# Patient Record
Sex: Male | Born: 1965 | Race: White | Hispanic: No | Marital: Married | Smoking: Former smoker
Health system: Southern US, Community
[De-identification: ages and names within clinical notes are randomized; demographics above are authoritative.]

## PROBLEM LIST (undated history)

## (undated) DIAGNOSIS — K4020 Bilateral inguinal hernia, without obstruction or gangrene, not specified as recurrent: Secondary | ICD-10-CM

## (undated) DIAGNOSIS — M5020 Other cervical disc displacement, unspecified cervical region: Secondary | ICD-10-CM

## (undated) DIAGNOSIS — R51 Headache: Secondary | ICD-10-CM

## (undated) DIAGNOSIS — M1990 Unspecified osteoarthritis, unspecified site: Secondary | ICD-10-CM

## (undated) DIAGNOSIS — E7800 Pure hypercholesterolemia, unspecified: Secondary | ICD-10-CM

## (undated) DIAGNOSIS — F329 Major depressive disorder, single episode, unspecified: Secondary | ICD-10-CM

## (undated) DIAGNOSIS — F32A Depression, unspecified: Secondary | ICD-10-CM

## (undated) DIAGNOSIS — R519 Headache, unspecified: Secondary | ICD-10-CM

## (undated) DIAGNOSIS — K219 Gastro-esophageal reflux disease without esophagitis: Secondary | ICD-10-CM

## (undated) HISTORY — PX: COLONOSCOPY: SHX174

## (undated) HISTORY — PX: KNEE SURGERY: SHX244

## (undated) HISTORY — PX: OTHER SURGICAL HISTORY: SHX169

## (undated) HISTORY — PX: NECK SURGERY: SHX720

## (undated) HISTORY — DX: Other cervical disc displacement, unspecified cervical region: M50.20

## (undated) HISTORY — PX: ORTHOPEDIC SURGERY: SHX850

## (undated) HISTORY — PX: JOINT REPLACEMENT: SHX530

---

## 2004-04-13 ENCOUNTER — Inpatient Hospital Stay (HOSPITAL_COMMUNITY): Admission: RE | Admit: 2004-04-13 | Discharge: 2004-04-17 | Payer: Self-pay | Admitting: Specialist

## 2014-12-20 HISTORY — PX: OTHER SURGICAL HISTORY: SHX169

## 2015-02-05 ENCOUNTER — Encounter (HOSPITAL_COMMUNITY): Payer: Self-pay

## 2015-02-05 ENCOUNTER — Emergency Department (HOSPITAL_COMMUNITY)
Admission: EM | Admit: 2015-02-05 | Discharge: 2015-02-05 | Disposition: A | Discharge status: Home / Self Care | Payer: Medicare Other | Attending: Emergency Medicine | Admitting: Emergency Medicine

## 2015-02-05 ENCOUNTER — Emergency Department (HOSPITAL_COMMUNITY): Payer: Medicare Other

## 2015-02-05 DIAGNOSIS — R079 Chest pain, unspecified: Secondary | ICD-10-CM | POA: Diagnosis present

## 2015-02-05 DIAGNOSIS — M791 Myalgia: Secondary | ICD-10-CM | POA: Diagnosis not present

## 2015-02-05 DIAGNOSIS — Z8659 Personal history of other mental and behavioral disorders: Secondary | ICD-10-CM | POA: Insufficient documentation

## 2015-02-05 DIAGNOSIS — R0789 Other chest pain: Secondary | ICD-10-CM | POA: Diagnosis not present

## 2015-02-05 DIAGNOSIS — Z8719 Personal history of other diseases of the digestive system: Secondary | ICD-10-CM | POA: Diagnosis not present

## 2015-02-05 DIAGNOSIS — R011 Cardiac murmur, unspecified: Secondary | ICD-10-CM | POA: Diagnosis not present

## 2015-02-05 DIAGNOSIS — Z8639 Personal history of other endocrine, nutritional and metabolic disease: Secondary | ICD-10-CM | POA: Diagnosis not present

## 2015-02-05 HISTORY — DX: Depression, unspecified: F32.A

## 2015-02-05 HISTORY — DX: Major depressive disorder, single episode, unspecified: F32.9

## 2015-02-05 HISTORY — DX: Gastro-esophageal reflux disease without esophagitis: K21.9

## 2015-02-05 HISTORY — DX: Pure hypercholesterolemia, unspecified: E78.00

## 2015-02-05 LAB — CBC
HCT: 45.1 % (ref 39.0–52.0)
HEMOGLOBIN: 15.2 g/dL (ref 13.0–17.0)
MCH: 32.5 pg (ref 26.0–34.0)
MCHC: 33.7 g/dL (ref 30.0–36.0)
MCV: 96.4 fL (ref 78.0–100.0)
PLATELETS: 261 10*3/uL (ref 150–400)
RBC: 4.68 MIL/uL (ref 4.22–5.81)
RDW: 13.2 % (ref 11.5–15.5)
WBC: 7.4 10*3/uL (ref 4.0–10.5)

## 2015-02-05 LAB — BASIC METABOLIC PANEL
Anion gap: 8 (ref 5–15)
BUN: 13 mg/dL (ref 6–23)
CO2: 25 mmol/L (ref 19–32)
Calcium: 9.3 mg/dL (ref 8.4–10.5)
Chloride: 104 mmol/L (ref 96–112)
Creatinine, Ser: 0.84 mg/dL (ref 0.50–1.35)
GFR calc Af Amer: >90 mL/min (ref >90)
GLUCOSE: 109 mg/dL — AB (ref 70–99)
POTASSIUM: 4 mmol/L (ref 3.5–5.1)
Sodium: 137 mmol/L (ref 135–145)

## 2015-02-05 LAB — TROPONIN I
Troponin I: <0.03 ng/mL (ref <0.031)
Troponin I: <0.03 ng/mL (ref <0.031)

## 2015-02-05 LAB — HEPATIC FUNCTION PANEL
ALT: 43 U/L (ref 0–53)
AST: 33 U/L (ref 0–37)
Albumin: 4.2 g/dL (ref 3.5–5.2)
Alkaline Phosphatase: 78 U/L (ref 39–117)
Total Bilirubin: 0.6 mg/dL (ref 0.3–1.2)
Total Protein: 7.7 g/dL (ref 6.0–8.3)

## 2015-02-05 MED ORDER — MORPHINE SULFATE 4 MG/ML IJ SOLN
4.0000 mg | Freq: Once | INTRAMUSCULAR | Status: AC
  Administered 2015-02-05: 4 mg via INTRAVENOUS
  Filled 2015-02-05: qty 1

## 2015-02-05 MED ORDER — MORPHINE SULFATE 4 MG/ML IJ SOLN
INTRAMUSCULAR | Status: AC
  Filled 2015-02-05: qty 1

## 2015-02-05 MED ORDER — MORPHINE SULFATE 4 MG/ML IJ SOLN
4.0000 mg | Freq: Once | INTRAMUSCULAR | Status: AC
  Administered 2015-02-05: 4 mg via INTRAVENOUS

## 2015-02-05 NOTE — ED Provider Notes (Signed)
CSN: [id number]     Arrival date & time 02/05/15  1025 History  This chart was scribed for Kevin Diego, MD by Chester Holstein, ED Scribe. This patient was seen in room APA02/APA02 and the patient's care was started at 11:52 AM.    Chief Complaint  Patient presents with  . Chest Pain      Patient is a 49 y.o. male presenting with chest pain. The history is provided by the patient. No language interpreter was used.  Chest Pain Associated symptoms: no abdominal pain, no back pain, no cough, no diaphoresis, no fatigue, no headache and no shortness of breath    HPI Comments: Kevin Hill is a 49 y.o. male with PMHx of HLD, depression, and anxiety who presents to the Emergency Department complaining of intermittent achy left sided chest pain with onset 2 days ago. Pt notes pain radiates to left arm. Pt denies any aggravating or alleviating factors. Pt has h/o bulging cervical dics, causing recurrent pain in right arm at baseline. Pt was seen by orthopedist this morning and advised to come to ED. Pt denies SOB and diaphoresis. Pt is a smoker.  Pt has been seen by a cardiologist in past for h/o heart murmur. Pt's PCP is Dr. Ahmed Prima.   Past Medical History  Diagnosis Date  . High cholesterol   . Depression   . Acid reflux    Past Surgical History  Procedure Laterality Date  . Orthopedic surgery     No family history on file. History  Substance Use Topics  . Smoking status: Not on file  . Smokeless tobacco: Not on file  . Alcohol Use: No    Review of Systems  Constitutional: Negative for diaphoresis, appetite change and fatigue.  HENT: Negative for congestion, ear discharge and sinus pressure.   Eyes: Negative for discharge.  Respiratory: Negative for cough and shortness of breath.   Cardiovascular: Positive for chest pain.  Gastrointestinal: Negative for abdominal pain and diarrhea.  Genitourinary: Negative for frequency and hematuria.  Musculoskeletal: Positive for myalgias.  Negative for back pain.  Skin: Negative for rash.  Neurological: Negative for seizures and headaches.  Psychiatric/Behavioral: Negative for hallucinations.      Allergies  Review of patient's allergies indicates no known allergies.  Home Medications   Prior to Admission medications   Not on File   BP 138/92 mmHg  Pulse 72  Temp(Src) 98.1 F (36.7 C) (Oral)  Resp 16  Ht 5\' 9"  (1.753 m)  Wt 184 lb (83.462 kg)  BMI 27.16 kg/m2  SpO2 97% Physical Exam  Constitutional: He is oriented to person, place, and time. He appears well-developed and well-nourished.  HENT:  Head: Normocephalic.  Eyes: Conjunctivae and EOM are normal. No scleral icterus.  Neck: Neck supple. No thyromegaly present.  Cardiovascular: Normal rate and regular rhythm.  Exam reveals no gallop and no friction rub.   No murmur heard. Pulmonary/Chest: Effort normal and breath sounds normal. No stridor. He has no wheezes. He has no rales. He exhibits no tenderness.  Abdominal: He exhibits no distension. There is no tenderness. There is no rebound.  Musculoskeletal: Normal range of motion. He exhibits no edema.  Lymphadenopathy:    He has no cervical adenopathy.  Neurological: He is alert and oriented to person, place, and time. He exhibits normal muscle tone. Coordination normal.  Skin: No rash noted. No erythema.  Psychiatric: He has a normal mood and affect. His behavior is normal.  Nursing note and vitals reviewed.  ED Course  Procedures (including critical care time) DIAGNOSTIC STUDIES: Oxygen Saturation is 97% on room air, normal by my interpretation.    COORDINATION OF CARE: 11:54 AM Discussed treatment plan with patient at beside, the patient agrees with the plan and has no further questions at this time.    Labs Review Labs Reviewed  CBC  BASIC METABOLIC PANEL  TROPONIN I    Imaging Review No results found.   EKG Interpretation   Date/Time:  Wednesday February 05 2015 10:37:16  EST Ventricular Rate:  76 PR Interval:  166 QRS Duration: 95 QT Interval:  380 QTC Calculation: 427 R Axis:   62 Text Interpretation:  Sinus rhythm Probable left atrial enlargement  Confirmed by Damarian Priola  MD, Teiana Hajduk [phone]) on 02/05/2015 3:06:20 PM      MDM   Final diagnoses:  None   Chest pain with 2 negative troponins,   Will have pt follow up with card    The chart was scribed for me under my direct supervision.  I personally performed the history, physical, and medical decision making and all procedures in the evaluation of this patient.Kevin Diego, MD 02/05/15 [phone]

## 2015-02-05 NOTE — ED Notes (Signed)
Pt states he lives in [place] but he didn't want to go to their ER. States he looked up his symptoms on the Internet and it scared him so he came here

## 2015-02-05 NOTE — ED Notes (Signed)
Pt complain of chest pain that started last night. Pt states he has bulging disk in his neck

## 2015-02-05 NOTE — Discharge Instructions (Signed)
Take one aspirin a day and follow up with [facility] in [place] in a week.

## 2015-06-09 ENCOUNTER — Telehealth: Payer: Self-pay

## 2015-06-09 NOTE — Telephone Encounter (Signed)
Pt was referred by [facility] for screening colonoscopy. He had left VM he is ready to schedule and I returned call and LMOM to call. ( Please see med list, looks like he will need OV).

## 2015-06-10 NOTE — Telephone Encounter (Signed)
I called pt and he is scheduled an OV on 06/18/2015 at 9:00 AM with Walden Field, NP due to his med list.

## 2015-06-18 ENCOUNTER — Encounter: Payer: Self-pay | Admitting: Nurse Practitioner

## 2015-06-18 ENCOUNTER — Ambulatory Visit (INDEPENDENT_AMBULATORY_CARE_PROVIDER_SITE_OTHER): Payer: Medicare Other | Admitting: Nurse Practitioner

## 2015-06-18 ENCOUNTER — Other Ambulatory Visit: Payer: Self-pay

## 2015-06-18 VITALS — BP 139/80 | HR 91 | Temp 97.6°F | Ht 69.0 in | Wt 190.0 lb

## 2015-06-18 DIAGNOSIS — Z1211 Encounter for screening for malignant neoplasm of colon: Secondary | ICD-10-CM | POA: Insufficient documentation

## 2015-06-18 MED ORDER — PEG 3350-KCL-NA BICARB-NACL 420 G PO SOLR: 4000.0000 mL | Freq: Once | ORAL | Status: DC

## 2015-06-18 NOTE — Progress Notes (Signed)
cc'd to pcp 

## 2015-06-18 NOTE — Assessment & Plan Note (Signed)
49 year old male presents for screening colonoscopy. Has had a previous colonoscopy over 10 years ago but cannot remember what for this was completed in [place]. Was referred by PCP. Is currently asymptomatic from a GI standpoint. No red flag/warning signs or symptoms. We will proceed with colonoscopy.  Proceed with TCS in the OR with propofol with Dr. Gala Romney in near future: the risks, benefits, and alternatives have been discussed with the patient in detail. The patient states understanding and desires to proceed.  The patient is not on any anticoagulants other than 81 mg daily aspirin. He is on Wellbutrin 150 mg twice a day, Zoloft 100 mg daily, Ambien at night, and Xanax when necessary. He also consumes about a 12 pack of alcohol a week. Due to polypharmacy and alcohol consumption we'll plan for procedure and the OR. He states he has never had a problem with anesthesia is by multiple surgeries.

## 2015-06-18 NOTE — Progress Notes (Signed)
Primary Care Physician:  Renee Rival, NP Primary Gastroenterologist:  Dr. Gala Romney  Chief Complaint  Patient presents with  . Colonoscopy    HPI:   49 year old male presents on referral from PCP for initial screening colonoscopy. PCP note reviewed. Was recently in the ER for chest pain and workup was normal and felt to be due to stress/anxiety. Patient was not able to be phone triage and referred for office visit due to medications. No GI procedures found in the HR system.   Today he states he had a colonoscopy over 10 years ago in [place] for reasons he cannot remember. Denies abdominal pain, N/V, hematochezia, melena. Denies chest pain, dyspnea, dizziness, lightheadedness, syncope, near syncope. Denies any other upper or lower GI symptoms.   Past Medical History  Diagnosis Date  . High cholesterol   . Depression   . Acid reflux     Past Surgical History  Procedure Laterality Date  . Orthopedic surgery    . Colonoscopy  about 10 years ago    in [place]  . Knee surgery      right  . Neck surgery      Current Outpatient Prescriptions  Medication Sig Dispense Refill  . ALPRAZolam (XANAX) 0.5 MG tablet Take 0.5 mg by mouth daily as needed for anxiety.    Marland Kitchen buPROPion (WELLBUTRIN SR) 150 MG 12 hr tablet Take 150 mg by mouth 2 (two) times daily.    Marland Kitchen omeprazole (PRILOSEC) 20 MG capsule Take 20 mg by mouth daily.    . pravastatin (PRAVACHOL) 40 MG tablet Take 40 mg by mouth daily.    . sertraline (ZOLOFT) 100 MG tablet Take 200 mg by mouth daily.    Marland Kitchen zolpidem (AMBIEN) 5 MG tablet Take 5 mg by mouth at bedtime as needed for sleep.    Marland Kitchen HYDROcodone-acetaminophen (NORCO/VICODIN) 5-325 MG per tablet Take 1 tablet by mouth every 6 (six) hours as needed (pain).     No current facility-administered medications for this visit.    Allergies as of 06/18/2015  . (No Known Allergies)    Family History  Problem Relation Age of Onset  . Colon cancer Neg Hx     History    Social History  . Marital Status: Divorced    Spouse Name: N/A  . Number of Children: N/A  . Years of Education: N/A   Occupational History  . Not on file.   Social History Main Topics  . Smoking status: Current Every Day Smoker -- 0.50 packs/day    Types: Cigarettes  . Smokeless tobacco: Not on file  . Alcohol Use: 0.0 oz/week    0 Standard drinks or equivalent per week     Comment: 12 pack in a week  . Drug Use: No  . Sexual Activity: Not on file   Other Topics Concern  . Not on file   Social History Narrative    Review of Systems: General: Negative for anorexia, weight loss, fever, chills, fatigue, weakness. Eyes: Negative for vision changes.  ENT: Negative for hoarseness, difficulty swallowing. CV: Negative for chest pain, angina, palpitations, dyspnea on exertion, peripheral edema.  Respiratory: Negative for dyspnea at rest, dyspnea on exertion, cough, sputum, wheezing.  GI: See history of present illness. MS: Admits chronic neck pain.  Derm: Negative for rash or itching.  Endo: Negative for unusual weight change.  Heme: Negative for bruising or bleeding.    Physical Exam: BP 139/80 mmHg  Pulse 91  Temp(Src) 97.6 F (36.4  C) (Oral)  Ht 5\' 9"  (1.753 m)  Wt 190 lb (86.183 kg)  BMI 28.05 kg/m2 General:   Alert and oriented. Pleasant and cooperative. Well-nourished and well-developed.  Head:  Normocephalic and atraumatic. Eyes:  Without icterus, sclera clear and conjunctiva pink.  Ears:  Normal auditory acuity. Cardiovascular:  S1, S2 present without murmurs appreciated. Normal pulses noted. Extremities without clubbing or edema. Respiratory:  Clear to auscultation bilaterally. No wheezes, rales, or rhonchi. No distress.  Gastrointestinal:  +BS, soft, non-tender and non-distended. No HSM noted. No guarding or rebound. No masses appreciated.  Rectal:  Deferred  Skin:  Intact without significant lesions or rashes. Neurologic:  Alert and oriented x4;  grossly  normal neurologically. Psych:  Alert and cooperative. Normal mood and affect. Heme/Lymph/Immune: No excessive bruising noted.    06/18/2015 9:27 AM

## 2015-06-18 NOTE — Patient Instructions (Signed)
1. We will schedule your procedure for you. 2. Further recommendations to be based on results of your procedure. 

## 2015-07-07 NOTE — Patient Instructions (Signed)
Kevin Hill  07/07/2015     @PREFPERIOPPHARMACY @   Your procedure is scheduled on  07/10/2015   Report to [facility] at  730  A.M.  Call this number if you have problems the morning of surgery:  [phone]   Remember:  Do not eat food or drink liquids after midnight.  Take these medicines the morning of surgery with A SIP OF WATER xanax, wellbutrin, prilosec, zoloft   Do not wear jewelry, make-up or nail polish.  Do not wear lotions, powders, or perfumes.   Do not shave 48 hours prior to surgery.  Men may shave face and neck.  Do not bring valuables to the hospital.  [facility] is not responsible for any belongings or valuables.  Contacts, dentures or bridgework may not be worn into surgery.  Leave your suitcase in the car.  After surgery it may be brought to your room.  For patients admitted to the hospital, discharge time will be determined by your treatment team.  Patients discharged the day of surgery will not be allowed to drive home.   Name and phone number of your driver:   family Special instructions:  Follow the diet and prep instructions given to you by Dr Roseanne Kaufman office.  Please read over the following fact sheets that you were given. Pain Booklet, Coughing and Deep Breathing, Surgical Site Infection Prevention, Anesthesia Post-op Instructions and Care and Recovery After Surgery      Colonoscopy A colonoscopy is an exam to look at the entire large intestine (colon). This exam can help find problems such as tumors, polyps, inflammation, and areas of bleeding. The exam takes about 1 hour.  LET [facility] CARE PROVIDER KNOW ABOUT:   Any allergies you have.  All medicines you are taking, including vitamins, herbs, eye drops, creams, and over-the-counter medicines.  Previous problems you or members of your family have had with the use of anesthetics.  Any blood disorders you have.  Previous surgeries you have had.  Medical conditions you  have. RISKS AND COMPLICATIONS  Generally, this is a safe procedure. However, as with any procedure, complications can occur. Possible complications include:  Bleeding.  Tearing or rupture of the colon wall.  Reaction to medicines given during the exam.  Infection (rare). BEFORE THE PROCEDURE   Ask your health care provider about changing or stopping your regular medicines.  You may be prescribed an oral bowel prep. This involves drinking a large amount of medicated liquid, starting the day before your procedure. The liquid will cause you to have multiple loose stools until your stool is almost clear or light green. This cleans out your colon in preparation for the procedure.  Do not eat or drink anything else once you have started the bowel prep, unless your health care provider tells you it is safe to do so.  Arrange for someone to drive you home after the procedure. PROCEDURE   You will be given medicine to help you relax (sedative).  You will lie on your side with your knees bent.  A long, flexible tube with a light and camera on the end (colonoscope) will be inserted through the rectum and into the colon. The camera sends video back to a computer screen as it moves through the colon. The colonoscope also releases carbon dioxide gas to inflate the colon. This helps your health care provider see the area better.  During the exam, your health care provider may take a  small tissue sample (biopsy) to be examined under a microscope if any abnormalities are found.  The exam is finished when the entire colon has been viewed. AFTER THE PROCEDURE   Do not drive for 24 hours after the exam.  You may have a small amount of blood in your stool.  You may pass moderate amounts of gas and have mild abdominal cramping or bloating. This is caused by the gas used to inflate your colon during the exam.  Ask when your test results will be ready and how you will get your results. Make sure you  get your test results. Document Released: 12/03/2000 Document Revised: 09/26/2013 Document Reviewed: 08/13/2013 [facility] Patient Information 2015 [place], [place]. This information is not intended to replace advice given to you by your health care provider. Make sure you discuss any questions you have with your health care provider. PATIENT INSTRUCTIONS POST-ANESTHESIA  IMMEDIATELY FOLLOWING SURGERY:  Do not drive or operate machinery for the first twenty four hours after surgery.  Do not make any important decisions for twenty four hours after surgery or while taking narcotic pain medications or sedatives.  If you develop intractable nausea and vomiting or a severe headache please notify your doctor immediately.  FOLLOW-UP:  Please make an appointment with your surgeon as instructed. You do not need to follow up with anesthesia unless specifically instructed to do so.  WOUND CARE INSTRUCTIONS (if applicable):  Keep a dry clean dressing on the anesthesia/puncture wound site if there is drainage.  Once the wound has quit draining you may leave it open to air.  Generally you should leave the bandage intact for twenty four hours unless there is drainage.  If the epidural site drains for more than 36-48 hours please call the anesthesia department.  QUESTIONS?:  Please feel free to call your physician or the hospital operator if you have any questions, and they will be happy to assist you.

## 2015-07-08 ENCOUNTER — Encounter (HOSPITAL_COMMUNITY)
Admission: RE | Admit: 2015-07-08 | Discharge: 2015-07-08 | Disposition: A | Discharge status: Home / Self Care | Payer: Medicare Other | Source: Ambulatory Visit | Attending: Internal Medicine | Admitting: Internal Medicine

## 2015-07-08 ENCOUNTER — Encounter (HOSPITAL_COMMUNITY): Payer: Self-pay

## 2015-07-08 DIAGNOSIS — E780 Pure hypercholesterolemia: Secondary | ICD-10-CM | POA: Diagnosis not present

## 2015-07-08 DIAGNOSIS — M1990 Unspecified osteoarthritis, unspecified site: Secondary | ICD-10-CM | POA: Diagnosis not present

## 2015-07-08 DIAGNOSIS — F17210 Nicotine dependence, cigarettes, uncomplicated: Secondary | ICD-10-CM | POA: Diagnosis not present

## 2015-07-08 DIAGNOSIS — F329 Major depressive disorder, single episode, unspecified: Secondary | ICD-10-CM | POA: Diagnosis not present

## 2015-07-08 DIAGNOSIS — D123 Benign neoplasm of transverse colon: Secondary | ICD-10-CM | POA: Diagnosis not present

## 2015-07-08 DIAGNOSIS — Z79899 Other long term (current) drug therapy: Secondary | ICD-10-CM | POA: Diagnosis not present

## 2015-07-08 DIAGNOSIS — Z1211 Encounter for screening for malignant neoplasm of colon: Secondary | ICD-10-CM | POA: Diagnosis not present

## 2015-07-08 DIAGNOSIS — K5730 Diverticulosis of large intestine without perforation or abscess without bleeding: Secondary | ICD-10-CM | POA: Diagnosis not present

## 2015-07-08 DIAGNOSIS — D120 Benign neoplasm of cecum: Secondary | ICD-10-CM | POA: Diagnosis not present

## 2015-07-08 DIAGNOSIS — K219 Gastro-esophageal reflux disease without esophagitis: Secondary | ICD-10-CM | POA: Diagnosis not present

## 2015-07-08 DIAGNOSIS — D122 Benign neoplasm of ascending colon: Secondary | ICD-10-CM | POA: Diagnosis not present

## 2015-07-08 HISTORY — DX: Unspecified osteoarthritis, unspecified site: M19.90

## 2015-07-08 HISTORY — DX: Headache, unspecified: R51.9

## 2015-07-08 HISTORY — DX: Headache: R51

## 2015-07-08 LAB — CBC WITH DIFFERENTIAL/PLATELET
BASOS ABS: 0.1 10*3/uL (ref 0.0–0.1)
BASOS PCT: 1 % (ref 0–1)
EOS ABS: 0.6 10*3/uL (ref 0.0–0.7)
EOS PCT: 9 % — AB (ref 0–5)
HCT: 43.5 % (ref 39.0–52.0)
Hemoglobin: 14.5 g/dL (ref 13.0–17.0)
LYMPHS PCT: 36 % (ref 12–46)
Lymphs Abs: 2.4 10*3/uL (ref 0.7–4.0)
MCH: 30.9 pg (ref 26.0–34.0)
MCHC: 33.3 g/dL (ref 30.0–36.0)
MCV: 92.8 fL (ref 78.0–100.0)
MONO ABS: 0.7 10*3/uL (ref 0.1–1.0)
Monocytes Relative: 10 % (ref 3–12)
NEUTROS ABS: 3 10*3/uL (ref 1.7–7.7)
NEUTROS PCT: 44 % (ref 43–77)
Platelets: 278 10*3/uL (ref 150–400)
RBC: 4.69 MIL/uL (ref 4.22–5.81)
RDW: 13.1 % (ref 11.5–15.5)
WBC: 6.7 10*3/uL (ref 4.0–10.5)

## 2015-07-08 LAB — BASIC METABOLIC PANEL
ANION GAP: 7 (ref 5–15)
BUN: 12 mg/dL (ref 6–20)
CALCIUM: 9.2 mg/dL (ref 8.9–10.3)
CHLORIDE: 105 mmol/L (ref 101–111)
CO2: 26 mmol/L (ref 22–32)
Creatinine, Ser: 0.81 mg/dL (ref 0.61–1.24)
GLUCOSE: 78 mg/dL (ref 65–99)
Potassium: 4.5 mmol/L (ref 3.5–5.1)
SODIUM: 138 mmol/L (ref 135–145)

## 2015-07-10 ENCOUNTER — Encounter (HOSPITAL_COMMUNITY): Payer: Self-pay

## 2015-07-10 ENCOUNTER — Encounter (HOSPITAL_COMMUNITY)
Admission: RE | Disposition: A | Discharge status: Home / Self Care | Payer: Self-pay | Source: Ambulatory Visit | Attending: Internal Medicine

## 2015-07-10 ENCOUNTER — Ambulatory Visit (HOSPITAL_COMMUNITY): Payer: Medicare Other | Admitting: Anesthesiology

## 2015-07-10 ENCOUNTER — Ambulatory Visit (HOSPITAL_COMMUNITY)
Admission: RE | Admit: 2015-07-10 | Discharge: 2015-07-10 | Disposition: A | Discharge status: Home / Self Care | Payer: Medicare Other | Source: Ambulatory Visit | Attending: Internal Medicine | Admitting: Internal Medicine

## 2015-07-10 DIAGNOSIS — Z1211 Encounter for screening for malignant neoplasm of colon: Secondary | ICD-10-CM | POA: Diagnosis not present

## 2015-07-10 DIAGNOSIS — D120 Benign neoplasm of cecum: Secondary | ICD-10-CM | POA: Diagnosis not present

## 2015-07-10 DIAGNOSIS — K5730 Diverticulosis of large intestine without perforation or abscess without bleeding: Secondary | ICD-10-CM | POA: Diagnosis not present

## 2015-07-10 DIAGNOSIS — F17210 Nicotine dependence, cigarettes, uncomplicated: Secondary | ICD-10-CM | POA: Insufficient documentation

## 2015-07-10 DIAGNOSIS — D122 Benign neoplasm of ascending colon: Secondary | ICD-10-CM | POA: Diagnosis not present

## 2015-07-10 DIAGNOSIS — M1990 Unspecified osteoarthritis, unspecified site: Secondary | ICD-10-CM | POA: Insufficient documentation

## 2015-07-10 DIAGNOSIS — D123 Benign neoplasm of transverse colon: Secondary | ICD-10-CM | POA: Insufficient documentation

## 2015-07-10 DIAGNOSIS — K219 Gastro-esophageal reflux disease without esophagitis: Secondary | ICD-10-CM | POA: Insufficient documentation

## 2015-07-10 DIAGNOSIS — F329 Major depressive disorder, single episode, unspecified: Secondary | ICD-10-CM | POA: Insufficient documentation

## 2015-07-10 DIAGNOSIS — Z86010 Personal history of colonic polyps: Secondary | ICD-10-CM | POA: Insufficient documentation

## 2015-07-10 DIAGNOSIS — Z79899 Other long term (current) drug therapy: Secondary | ICD-10-CM | POA: Insufficient documentation

## 2015-07-10 DIAGNOSIS — E780 Pure hypercholesterolemia: Secondary | ICD-10-CM | POA: Insufficient documentation

## 2015-07-10 HISTORY — PX: COLONOSCOPY WITH PROPOFOL: SHX5780

## 2015-07-10 HISTORY — PX: POLYPECTOMY: SHX5525

## 2015-07-10 SURGERY — COLONOSCOPY WITH PROPOFOL
Anesthesia: Monitor Anesthesia Care

## 2015-07-10 MED ORDER — PROPOFOL INFUSION 10 MG/ML OPTIME
INTRAVENOUS | Status: DC | PRN
  Administered 2015-07-10: 09:00:00 via INTRAVENOUS
  Administered 2015-07-10: 150 ug/kg/min via INTRAVENOUS

## 2015-07-10 MED ORDER — FENTANYL CITRATE (PF) 100 MCG/2ML IJ SOLN: 25.0000 ug | INTRAMUSCULAR | Status: DC | PRN

## 2015-07-10 MED ORDER — PROPOFOL 10 MG/ML IV BOLUS
INTRAVENOUS | Status: DC | PRN
  Administered 2015-07-10: 20 mg via INTRAVENOUS

## 2015-07-10 MED ORDER — MIDAZOLAM HCL 5 MG/5ML IJ SOLN
INTRAMUSCULAR | Status: DC | PRN
  Administered 2015-07-10: 2 mg via INTRAVENOUS

## 2015-07-10 MED ORDER — WATER FOR IRRIGATION, STERILE IR SOLN
Status: DC | PRN
  Administered 2015-07-10: 1000 mL

## 2015-07-10 MED ORDER — GLYCOPYRROLATE 0.2 MG/ML IJ SOLN
INTRAMUSCULAR | Status: AC
  Filled 2015-07-10: qty 1

## 2015-07-10 MED ORDER — STERILE WATER FOR IRRIGATION IR SOLN
Status: DC | PRN
  Administered 2015-07-10: 1000 mL

## 2015-07-10 MED ORDER — FENTANYL CITRATE (PF) 100 MCG/2ML IJ SOLN
25.0000 ug | INTRAMUSCULAR | Status: AC
  Administered 2015-07-10 (×2): 25 ug via INTRAVENOUS

## 2015-07-10 MED ORDER — PROPOFOL 10 MG/ML IV BOLUS
INTRAVENOUS | Status: AC
  Filled 2015-07-10: qty 20

## 2015-07-10 MED ORDER — MIDAZOLAM HCL 2 MG/2ML IJ SOLN
1.0000 mg | INTRAMUSCULAR | Status: DC | PRN
Start: 2015-07-10 — End: 2015-07-10
  Administered 2015-07-10: 2 mg via INTRAVENOUS
  Filled 2015-07-10: qty 2

## 2015-07-10 MED ORDER — ONDANSETRON HCL 4 MG/2ML IJ SOLN
INTRAMUSCULAR | Status: AC
  Filled 2015-07-10: qty 2

## 2015-07-10 MED ORDER — ONDANSETRON HCL 4 MG/2ML IJ SOLN: 4.0000 mg | Freq: Once | INTRAMUSCULAR | Status: DC | PRN

## 2015-07-10 MED ORDER — MIDAZOLAM HCL 2 MG/2ML IJ SOLN
INTRAMUSCULAR | Status: AC
  Filled 2015-07-10: qty 2

## 2015-07-10 MED ORDER — LACTATED RINGERS IV SOLN
INTRAVENOUS | Status: DC
  Administered 2015-07-10: 08:00:00 via INTRAVENOUS

## 2015-07-10 MED ORDER — ONDANSETRON HCL 4 MG/2ML IJ SOLN
4.0000 mg | Freq: Once | INTRAMUSCULAR | Status: AC
Start: 2015-07-10 — End: 2015-07-10
  Administered 2015-07-10: 4 mg via INTRAVENOUS

## 2015-07-10 MED ORDER — GLYCOPYRROLATE 0.2 MG/ML IJ SOLN
0.2000 mg | Freq: Once | INTRAMUSCULAR | Status: AC
  Administered 2015-07-10: 0.2 mg via INTRAVENOUS

## 2015-07-10 MED ORDER — FENTANYL CITRATE (PF) 100 MCG/2ML IJ SOLN
INTRAMUSCULAR | Status: AC
  Filled 2015-07-10: qty 2

## 2015-07-10 SURGICAL SUPPLY — 21 items
ELECT REM PT RETURN 9FT ADLT (ELECTROSURGICAL) ×3
ELECTRODE REM PT RTRN 9FT ADLT (ELECTROSURGICAL) ×1 IMPLANT
FCP BXJMBJMB 240X2.8X (CUTTING FORCEPS)
FLOOR PAD 36X40 (MISCELLANEOUS) ×3
FORCEPS BIOP RAD 4 LRG CAP 4 (CUTTING FORCEPS) ×3 IMPLANT
FORCEPS BIOP RJ4 240 W/NDL (CUTTING FORCEPS)
FORCEPS BXJMBJMB 240X2.8X (CUTTING FORCEPS) IMPLANT
FORMALIN 10 PREFIL 20ML (MISCELLANEOUS) ×9 IMPLANT
INJECTOR/SNARE I SNARE (MISCELLANEOUS) IMPLANT
KIT ENDO PROCEDURE PEN (KITS) ×3 IMPLANT
MANIFOLD NEPTUNE II (INSTRUMENTS) ×3 IMPLANT
NEEDLE SCLEROTHERAPY 25GX240 (NEEDLE) IMPLANT
PAD FLOOR 36X40 (MISCELLANEOUS) ×1 IMPLANT
PROBE APC STR FIRE (PROBE) IMPLANT
PROBE INJECTION GOLD (MISCELLANEOUS)
PROBE INJECTION GOLD 7FR (MISCELLANEOUS) IMPLANT
SNARE ROTATE MED OVAL 20MM (MISCELLANEOUS) ×3 IMPLANT
SNARE SHORT THROW 13M SML OVAL (MISCELLANEOUS) IMPLANT
TRAP SPECIMEN MUCOUS 40CC (MISCELLANEOUS) ×3 IMPLANT
TUBING IRRIGATION ENDOGATOR (MISCELLANEOUS) ×3 IMPLANT
WATER STERILE IRR 1000ML POUR (IV SOLUTION) ×3 IMPLANT

## 2015-07-10 NOTE — Interval H&P Note (Signed)
History and Physical Interval Note:  07/10/2015 8:50 AM  Kevin Hill  has presented today for surgery, with the diagnosis of screening  The various methods of treatment have been discussed with the patient and family. After consideration of risks, benefits and other options for treatment, the patient has consented to  Procedure(s) with comments: COLONOSCOPY WITH PROPOFOL (N/A) - 0900 as a surgical intervention .  The patient's history has been reviewed, patient examined, no change in status, stable for surgery.  I have reviewed the patient's chart and labs.  Questions were answered to the patient's satisfaction.     Kevin Hill  No change; screening TCS per plan.  The risks, benefits, limitations, alternatives and imponderables have been reviewed with the patient. Questions have been answered. All parties are agreeable.

## 2015-07-10 NOTE — H&P (View-Only) (Signed)
Primary Care Physician:  Renee Rival, NP Primary Gastroenterologist:  Dr. Gala Romney  Chief Complaint  Patient presents with  . Colonoscopy    HPI:   49 year old male presents on referral from PCP for initial screening colonoscopy. PCP note reviewed. Was recently in the ER for chest pain and workup was normal and felt to be due to stress/anxiety. Patient was not able to be phone triage and referred for office visit due to medications. No GI procedures found in the HR system.   Today he states he had a colonoscopy over 10 years ago in [place] for reasons he cannot remember. Denies abdominal pain, N/V, hematochezia, melena. Denies chest pain, dyspnea, dizziness, lightheadedness, syncope, near syncope. Denies any other upper or lower GI symptoms.   Past Medical History  Diagnosis Date  . High cholesterol   . Depression   . Acid reflux     Past Surgical History  Procedure Laterality Date  . Orthopedic surgery    . Colonoscopy  about 10 years ago    in [place]  . Knee surgery      right  . Neck surgery      Current Outpatient Prescriptions  Medication Sig Dispense Refill  . ALPRAZolam (XANAX) 0.5 MG tablet Take 0.5 mg by mouth daily as needed for anxiety.    Marland Kitchen buPROPion (WELLBUTRIN SR) 150 MG 12 hr tablet Take 150 mg by mouth 2 (two) times daily.    Marland Kitchen omeprazole (PRILOSEC) 20 MG capsule Take 20 mg by mouth daily.    . pravastatin (PRAVACHOL) 40 MG tablet Take 40 mg by mouth daily.    . sertraline (ZOLOFT) 100 MG tablet Take 200 mg by mouth daily.    Marland Kitchen zolpidem (AMBIEN) 5 MG tablet Take 5 mg by mouth at bedtime as needed for sleep.    Marland Kitchen HYDROcodone-acetaminophen (NORCO/VICODIN) 5-325 MG per tablet Take 1 tablet by mouth every 6 (six) hours as needed (pain).     No current facility-administered medications for this visit.    Allergies as of 06/18/2015  . (No Known Allergies)    Family History  Problem Relation Age of Onset  . Colon cancer Neg Hx     History    Social History  . Marital Status: Divorced    Spouse Name: N/A  . Number of Children: N/A  . Years of Education: N/A   Occupational History  . Not on file.   Social History Main Topics  . Smoking status: Current Every Day Smoker -- 0.50 packs/day    Types: Cigarettes  . Smokeless tobacco: Not on file  . Alcohol Use: 0.0 oz/week    0 Standard drinks or equivalent per week     Comment: 12 pack in a week  . Drug Use: No  . Sexual Activity: Not on file   Other Topics Concern  . Not on file   Social History Narrative    Review of Systems: General: Negative for anorexia, weight loss, fever, chills, fatigue, weakness. Eyes: Negative for vision changes.  ENT: Negative for hoarseness, difficulty swallowing. CV: Negative for chest pain, angina, palpitations, dyspnea on exertion, peripheral edema.  Respiratory: Negative for dyspnea at rest, dyspnea on exertion, cough, sputum, wheezing.  GI: See history of present illness. MS: Admits chronic neck pain.  Derm: Negative for rash or itching.  Endo: Negative for unusual weight change.  Heme: Negative for bruising or bleeding.    Physical Exam: BP 139/80 mmHg  Pulse 91  Temp(Src) 97.6 F (36.4  C) (Oral)  Ht 5\' 9"  (1.753 m)  Wt 190 lb (86.183 kg)  BMI 28.05 kg/m2 General:   Alert and oriented. Pleasant and cooperative. Well-nourished and well-developed.  Head:  Normocephalic and atraumatic. Eyes:  Without icterus, sclera clear and conjunctiva pink.  Ears:  Normal auditory acuity. Cardiovascular:  S1, S2 present without murmurs appreciated. Normal pulses noted. Extremities without clubbing or edema. Respiratory:  Clear to auscultation bilaterally. No wheezes, rales, or rhonchi. No distress.  Gastrointestinal:  +BS, soft, non-tender and non-distended. No HSM noted. No guarding or rebound. No masses appreciated.  Rectal:  Deferred  Skin:  Intact without significant lesions or rashes. Neurologic:  Alert and oriented x4;  grossly  normal neurologically. Psych:  Alert and cooperative. Normal mood and affect. Heme/Lymph/Immune: No excessive bruising noted.    06/18/2015 9:27 AM

## 2015-07-10 NOTE — Op Note (Deleted)
[facility] [address] [place], [address]   COLONOSCOPY PROCEDURE REPORT  PATIENT: Ashon, Rosenberg  MR#: [id number] BIRTHDATE: 1966-07-07 , 49  yrs. old GENDER: male ENDOSCOPIST: Dr. Cristopher Estimable. Rourk REFERRED OE:[id number] Strader, NP PROCEDURE DATE:  Jul 12, 2015 PROCEDURE:   Colonoscopy with snare polypectomy INDICATIONS:Average risk colorectal cancer screening examination. MEDICATIONS: Deep sedation per Dr.  [facility] ASA CLASS:       Class II  CONSENT: The risks, benefits, alternatives and imponderables including but not limited to bleeding, perforation as well as the possibility of a missed lesion have been reviewed.  The potential for biopsy, lesion removal, etc. have also been discussed. Questions have been answered.  All parties agreeable.  Please see the history and physical in the medical record for more information.  DESCRIPTION OF PROCEDURE:   After the risks benefits and alternatives of the procedure were thoroughly explained, informed consent was obtained.  The digital rectal exam revealed no rectal mass.   The     endoscope was introduced through the anus and advanced to the cecum, which was identified by both the appendix and ileocecal valve. No adverse events experienced.   The quality of the prep was adequate  The instrument was then slowly withdrawn as the colon was fully examined. Estimated blood loss is zero unless otherwise noted in this procedure report.      COLON FINDINGS: Normal-appearing rectal mucosa.  Scattered left-sided diverticula; (4) splenic flexure polyps?"the largest being approximate 7 mm in dimensions.  There were (4) ascending colon polyps?"the largest approximately 6 mm in dimensions.  There was (1) 3 mm polyp in the cecum; the remainder of the colonic mucosa appeared normal.  All polyps removed with cold snare technique except for the largest splenic flexure polyp which was removed with hot snare  technique. Retroflexion was performed. .  Withdrawal time=10 minutes 0 seconds.  The scope was withdrawn and the procedure completed. COMPLICATIONS:  ENDOSCOPIC IMPRESSION: Multiple colonic polyps?"removed as described above. Colonic diverticulosis.  RECOMMENDATIONS: Follow-up on pathology.  eSigned:  [place] Jul 12, 2015 3:47 PM Revised: 2015-07-12 3:47 PM  cc:  CPT CODES: ICD CODES:  The ICD and CPT codes recommended by this software are interpretations from the data that the clinical staff has captured with the software.  The verification of the translation of this report to the ICD and CPT codes and modifiers is the sole responsibility of the health care institution and practicing physician where this report was generated.  [place]. will not be held responsible for the validity of the ICD and CPT codes included on this report.  AMA assumes no liability for data contained or not contained herein. CPT is a Designer, television/film set of the [employer].  PATIENT NAME:  Janelle, Culton MR#: [id number]

## 2015-07-10 NOTE — Anesthesia Preprocedure Evaluation (Signed)
Anesthesia Evaluation  Patient identified by MRN, date of birth, ID band Patient awake    Reviewed: Allergy & Precautions, NPO status , Patient's Chart, lab work & pertinent test results  Airway Mallampati: II  TM Distance: >3 FB Neck ROM: Limited    Dental  (+) Partial Upper, Teeth Intact   Pulmonary Current Smoker,  breath sounds clear to auscultation        Cardiovascular negative cardio ROS  Rhythm:Regular Rate:Normal     Neuro/Psych  Headaches, PSYCHIATRIC DISORDERS Depression    GI/Hepatic GERD-  Medicated and Controlled,  Endo/Other    Renal/GU      Musculoskeletal  (+) Arthritis - (cervical HNP, very limited extension and RUE numbness),   Abdominal   Peds  Hematology   Anesthesia Other Findings   Reproductive/Obstetrics                             Anesthesia Physical Anesthesia Plan  ASA: II  Anesthesia Plan: MAC   Post-op Pain Management:    Induction: Intravenous  Airway Management Planned: Simple Face Mask  Additional Equipment:   Intra-op Plan:   Post-operative Plan:   Informed Consent: I have reviewed the patients History and Physical, chart, labs and discussed the procedure including the risks, benefits and alternatives for the proposed anesthesia with the patient or authorized representative who has indicated his/her understanding and acceptance.     Plan Discussed with:   Anesthesia Plan Comments: (Check head - neck position, no extension.)        Anesthesia Quick Evaluation

## 2015-07-10 NOTE — Transfer of Care (Signed)
Immediate Anesthesia Transfer of Care Note  Patient: Kevin Hill  Procedure(s) Performed: Procedure(s) with comments: COLONOSCOPY WITH PROPOFOL (N/A) - Cecum time in 0924   time out 0934  total time 10 minutese POLYPECTOMY (N/A) - cecal, Ascending Colon, Splenic Flexure  Patient Location: PACU  Anesthesia Type:MAC  Level of Consciousness: awake and patient cooperative  Airway & Oxygen Therapy: Patient Spontanous Breathing and Patient connected to face mask oxygen  Post-op Assessment: Report given to RN, Post -op Vital signs reviewed and stable and Patient moving all extremities  Post vital signs: Reviewed and stable  Last Vitals:  Filed Vitals:   07/10/15 0850  BP: 120/77  Pulse:   Temp:   Resp: 15    Complications: No apparent anesthesia complications

## 2015-07-10 NOTE — Discharge Instructions (Addendum)
Colonoscopy Discharge Instructions  Read the instructions outlined below and refer to this sheet in the next few weeks. These discharge instructions provide you with general information on caring for yourself after you leave the hospital. Your doctor may also give you specific instructions. While your treatment has been planned according to the most current medical practices available, unavoidable complications occasionally occur. If you have any problems or questions after discharge, call Dr. Gala Hill at [phone]. ACTIVITY  You may resume your regular activity, but move at a slower pace for the next 24 hours.   Take frequent rest periods for the next 24 hours.   Walking will help get rid of the air and reduce the bloated feeling in your belly (abdomen).   No driving for 24 hours (because of the medicine (anesthesia) used during the test).    Do not sign any important legal documents or operate any machinery for 24 hours (because of the anesthesia used during the test).  NUTRITION  Drink plenty of fluids.   You may resume your normal diet as instructed by your doctor.   Begin with a light meal and progress to your normal diet. Heavy or fried foods are harder to digest and may make you feel sick to your stomach (nauseated).   Avoid alcoholic beverages for 24 hours or as instructed.  MEDICATIONS  You may resume your normal medications unless your doctor tells you otherwise.  WHAT YOU CAN EXPECT TODAY  Some feelings of bloating in the abdomen.   Passage of more gas than usual.   Spotting of blood in your stool or on the toilet paper.  IF YOU HAD POLYPS REMOVED DURING THE COLONOSCOPY:  No aspirin products for 7 days or as instructed.   No alcohol for 7 days or as instructed.   Eat a soft diet for the next 24 hours.  FINDING OUT THE RESULTS OF YOUR TEST Not all test results are available during your visit. If your test results are not back during the visit, make an  appointment with your caregiver to find out the results. Do not assume everything is normal if you have not heard from your caregiver or the medical facility. It is important for you to follow up on all of your test results.  SEEK IMMEDIATE MEDICAL ATTENTION IF:  You have more than a spotting of blood in your stool.   Your belly is swollen (abdominal distention).   You are nauseated or vomiting.  You have a temperature over 101.    Colon Polyps Polyps are lumps of extra tissue growing inside the body. Polyps can grow in the large intestine (colon). Most colon polyps are noncancerous (benign). However, some colon polyps can become cancerous over time. Polyps that are larger than a pea may be harmful. To be safe, caregivers remove and test all polyps. CAUSES  Polyps form when mutations in the genes cause your cells to grow and divide even though no more tissue is needed. RISK FACTORS There are a number of risk factors that can increase your chances of getting colon polyps. They include: Being older than 50 years. Family history of colon polyps or colon cancer. Long-term colon diseases, such as colitis or Crohn disease. Being overweight. Smoking. Being inactive. Drinking too much alcohol. SYMPTOMS  Most small polyps do not cause symptoms. If symptoms are present, they may include: Blood in the stool. The stool may look dark red or black. Constipation or diarrhea that lasts longer than 1 week. DIAGNOSIS People often  do not know they have polyps until their caregiver finds them during a regular checkup. Your caregiver can use 4 tests to check for polyps: Digital rectal exam. The caregiver wears gloves and feels inside the rectum. This test would find polyps only in the rectum. Barium enema. The caregiver puts a liquid called barium into your rectum before taking X-rays of your colon. Barium makes your colon look white. Polyps are dark, so they are easy to see in the X-ray  pictures. Sigmoidoscopy. A thin, flexible tube (sigmoidoscope) is placed into your rectum. The sigmoidoscope has a light and tiny camera in it. The caregiver uses the sigmoidoscope to look at the last third of your colon. Colonoscopy. This test is like sigmoidoscopy, but the caregiver looks at the entire colon. This is the most common method for finding and removing polyps. TREATMENT  Any polyps will be removed during a sigmoidoscopy or colonoscopy. The polyps are then tested for cancer. PREVENTION  To help lower your risk of getting more colon polyps: Eat plenty of fruits and vegetables. Avoid eating fatty foods. Do not smoke. Avoid drinking alcohol. Exercise every day. Lose weight if recommended by your caregiver. Eat plenty of calcium and folate. Foods that are rich in calcium include milk, cheese, and broccoli. Foods that are rich in folate include chickpeas, kidney beans, and spinach. HOME CARE INSTRUCTIONS Keep all follow-up appointments as directed by your caregiver. You may need periodic exams to check for polyps. SEEK MEDICAL CARE IF: You notice bleeding during a bowel movement. Document Released: 09/01/2004 Document Revised: 02/28/2012 Document Reviewed: 02/15/2012 [facility] Patient Information 2015 [place], [place]. This information is not intended to replace advice given to you by your health care provider. Make sure you discuss any questions you have with your health care provider.   You have abdominal pain or discomfort that is severe or gets worse throughout the day.    Diverticulosis information provided  Multiple polyps were removed from your colon today  Further recommendations to follow pending review of pathology report  Diverticulosis Diverticulosis is the condition that develops when small pouches (diverticula) form in the wall of your colon. Your colon, or large intestine, is where water is absorbed and stool is formed. The pouches form when the inside layer of  your colon pushes through weak spots in the outer layers of your colon. CAUSES  No one knows exactly what causes diverticulosis. RISK FACTORS  Being older than 7. Your risk for this condition increases with age. Diverticulosis is rare in people younger than 40 years. By age 41, almost everyone has it.  Eating a low-fiber diet.  Being frequently constipated.  Being overweight.  Not getting enough exercise.  Smoking.  Taking over-the-counter pain medicines, like aspirin and ibuprofen. SYMPTOMS  Most people with diverticulosis do not have symptoms. DIAGNOSIS  Because diverticulosis often has no symptoms, health care providers often discover the condition during an exam for other colon problems. In many cases, a health care provider will diagnose diverticulosis while using a flexible scope to examine the colon (colonoscopy). TREATMENT  If you have never developed an infection related to diverticulosis, you may not need treatment. If you have had an infection before, treatment may include:  Eating more fruits, vegetables, and grains.  Taking a fiber supplement.  Taking a live bacteria supplement (probiotic).  Taking medicine to relax your colon. HOME CARE INSTRUCTIONS   Drink at least 6-8 glasses of water each day to prevent constipation.  Try not to strain when you  have a bowel movement.  Keep all follow-up appointments. If you have had an infection before:  Increase the fiber in your diet as directed by your health care provider or dietitian.  Take a dietary fiber supplement if your health care provider approves.  Only take medicines as directed by your health care provider. SEEK MEDICAL CARE IF:   You have abdominal pain.  You have bloating.  You have cramps.  You have not gone to the bathroom in 3 days. SEEK IMMEDIATE MEDICAL CARE IF:   Your pain gets worse.  Yourbloating becomes very bad.  You have a fever or chills, and your symptoms suddenly get  worse.  You begin vomiting.  You have bowel movements that are bloody or black. MAKE SURE YOU:  Understand these instructions.  Will watch your condition.  Will get help right away if you are not doing well or get worse. Document Released: 09/02/2004 Document Revised: 12/11/2013 Document Reviewed: 10/31/2013 [facility] Patient Information 2015 [place], [place]. This information is not intended to replace advice given to you by your health care provider. Make sure you discuss any questions you have with your health care provider.

## 2015-07-10 NOTE — Anesthesia Postprocedure Evaluation (Signed)
  Anesthesia Post-op Note  Patient: Kevin Hill  Procedure(s) Performed: Procedure(s) with comments: COLONOSCOPY WITH PROPOFOL (N/A) - Cecum time in 0924   time out 0934  total time 10 minutese POLYPECTOMY (N/A) - cecal, Ascending Colon, Splenic Flexure  Patient Location: PACU  Anesthesia Type:MAC  Level of Consciousness: awake, alert , oriented and patient cooperative  Airway and Oxygen Therapy: Patient Spontanous Breathing  Post-op Pain: none  Post-op Assessment: Post-op Vital signs reviewed, Patient's Cardiovascular Status Stable, Respiratory Function Stable, Patent Airway, No signs of Nausea or vomiting and Pain level controlled              Post-op Vital Signs: Reviewed and stable  Last Vitals:  Filed Vitals:   07/10/15 0850  BP: 120/77  Pulse:   Temp:   Resp: 15    Complications: No apparent anesthesia complications

## 2015-07-11 ENCOUNTER — Encounter (HOSPITAL_COMMUNITY): Payer: Self-pay | Admitting: Internal Medicine

## 2015-07-15 NOTE — Op Note (Signed)
Please see Colonoscopy report done by Dr. Cristopher Estimable. Rourk under the Media tab.

## 2015-07-16 ENCOUNTER — Encounter: Payer: Self-pay | Admitting: Internal Medicine

## 2015-07-28 NOTE — Op Note (Signed)
[facility] [address] [place], [address]   COLONOSCOPY PROCEDURE REPORT  PATIENT: Kevin Hill, Kevin Hill  MR#: [id number] BIRTHDATE: 13-Sep-1966 , 49  yrs. old GENDER: male ENDOSCOPIST: Dr. Cristopher Estimable. Rourk REFERRED QM:VHQIONG Strader, NP PROCEDURE DATE:  07/10/2015 PROCEDURE:   Colonoscopy with snare polypectomy INDICATIONS:Average risk colorectal cancer screening examination. MEDICATIONS: Deep sedation per Dr.  [facility] ASA CLASS:       Class II  CONSENT: The risks, benefits, alternatives and imponderables including but not limited to bleeding, perforation as well as the possibility of a missed lesion have been reviewed.  The potential for biopsy, lesion removal, etc. have also been discussed. Questions have been answered.  All parties agreeable.  Please see the history and physical in the medical record for more information.  DESCRIPTION OF PROCEDURE:   After the risks benefits and alternatives of the procedure were thoroughly explained, informed consent was obtained.  The digital rectal exam revealed no rectal mass.   The     endoscope was introduced through the anus and advanced to the cecum, which was identified by both the appendix and ileocecal valve. No adverse events experienced.   The quality of the prep was adequate  The instrument was then slowly withdrawn as the colon was fully examined. Estimated blood loss is zero unless otherwise noted in this procedure report.      COLON FINDINGS: Normal-appearing rectal mucosa.  Scattered left-sided diverticula; (4) splenic flexure polyps?"the largest being approximate 7 mm in dimensions.  There were (4) ascending colon polyps?"the largest approximately 6 mm in dimensions.  There was (1) 3 mm polyp in the cecum; the remainder of the colonic mucosa appeared normal.  All polyps removed with cold snare technique except for the largest splenic flexure polyp which was removed with hot snare  technique. Retroflexion was performed. .  Withdrawal time=10 minutes 0 seconds.  The scope was withdrawn and the procedure completed. COMPLICATIONS:  ENDOSCOPIC IMPRESSION: Multiple colonic polyps?"removed as described above. Colonic diverticulosis.  RECOMMENDATIONS: Follow-up on pathology.  eSigned:  Daneil Dolin, Physician July 31, 2015 12:19 PM Revised: 07/31/15 12:19 PM  cc:  CPT CODES: ICD CODES:  The ICD and CPT codes recommended by this software are interpretations from the data that the clinical staff has captured with the software.  The verification of the translation of this report to the ICD and CPT codes and modifiers is the sole responsibility of the health care institution and practicing physician where this report was generated.  [employer] will not be held responsible for the validity of the ICD and CPT codes included on this report.  AMA assumes no liability for data contained or not contained herein. CPT is a Designer, television/film set of the [employer].  PATIENT NAME:  Kevin Hill, Kevin Hill MR#: [id number]

## 2015-12-21 HISTORY — PX: OTHER SURGICAL HISTORY: SHX169

## 2018-04-25 MED FILL — ATORVASTATIN 40 MG TABLET: 40 | 30 days supply | Qty: 30 | Fill #0

## 2018-05-17 MED FILL — OMEPRAZOLE 20 MG CAP: 20 | 30 days supply | Qty: 30 | Fill #0

## 2018-06-05 ENCOUNTER — Encounter: Payer: Self-pay | Admitting: Internal Medicine

## 2018-06-19 MED FILL — ATORVASTATIN 40 MG TABLET: 40 | 30 days supply | Qty: 30 | Fill #1

## 2018-07-10 MED FILL — OMEPRAZOLE 20 MG CAP: 20 | 30 days supply | Qty: 30 | Fill #1

## 2018-07-10 MED FILL — CELECOXIB 200 MG CAP: 200 | 30 days supply | Qty: 30 | Fill #0

## 2018-08-07 MED FILL — CELECOXIB 200 MG CAP: 200 | 30 days supply | Qty: 30 | Fill #0

## 2018-08-08 MED FILL — OMEPRAZOLE 20 MG CAP: 20 | 30 days supply | Qty: 30 | Fill #2

## 2018-08-30 MED FILL — ATORVASTATIN 40 MG TABLET: 40 | 90 days supply | Qty: 90 | Fill #0

## 2018-09-01 ENCOUNTER — Encounter: Payer: Self-pay | Admitting: Gastroenterology

## 2018-09-01 ENCOUNTER — Encounter: Payer: Self-pay | Admitting: *Deleted

## 2018-09-01 ENCOUNTER — Other Ambulatory Visit: Payer: Self-pay | Admitting: *Deleted

## 2018-09-01 ENCOUNTER — Other Ambulatory Visit: Payer: Self-pay

## 2018-09-01 ENCOUNTER — Ambulatory Visit (INDEPENDENT_AMBULATORY_CARE_PROVIDER_SITE_OTHER): Payer: No Typology Code available for payment source | Admitting: Gastroenterology

## 2018-09-01 VITALS — BP 122/82 | HR 72 | Temp 97.8°F | Ht 69.0 in | Wt 185.2 lb

## 2018-09-01 DIAGNOSIS — Z86010 Personal history of colonic polyps: Secondary | ICD-10-CM

## 2018-09-01 MED ORDER — NA SULFATE-K SULFATE-MG SULF 17.5-3.13-1.6 GM/177ML PO SOLN: 1.0000 | ORAL | 0 refills | Status: DC

## 2018-09-01 MED FILL — SUPREP BOWEL PREP KIT: 17.5-3.13-1 | 1 days supply | Qty: 354 | Fill #0

## 2018-09-01 NOTE — Progress Notes (Signed)
Primary Care Physician:  Renee Rival, NP Primary Gastroenterologist:  Dr. Gala Romney   Chief Complaint  Patient presents with  . Colonoscopy    due for tcs    HPI:   Kevin Hill is a 52 y.o. 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  He has no concerning upper or lower GI symptoms. Denies dysphagia, GERD exacerbation, rectal bleeding, constipation, diarrhea, unexplained weight loss or lack of appetite. His only chronic issues that he is dealing with currently is joint pain, arthritis.    Past Medical History:  Diagnosis Date  . Acid reflux   . Arthritis   . Depression   . Headache   . Herniated disc, cervical   . High cholesterol     Past Surgical History:  Procedure Laterality Date  . cervical plates  6237  . COLONOSCOPY  about 10 years ago   in [place]  . COLONOSCOPY WITH PROPOFOL N/A 07/10/2015   Dr. Gala Romney: multiple colonic polyps, colonic diverticulosis (tubular adenomas)  . KNEE SURGERY     right  . NECK INJECTIONS    . NECK SURGERY    . ORTHOPEDIC SURGERY    . POLYPECTOMY N/A 07/10/2015   Procedure: POLYPECTOMY;  Surgeon: Daneil Dolin, MD;  Location: [facility];  Service: Endoscopy;  Laterality: N/A;  cecal, Ascending Colon, Splenic Flexure  . thumb surgery      Current Outpatient Medications  Medication Sig Dispense Refill  . aspirin 81 MG tablet Take 81 mg by mouth daily.    Marland Kitchen atorvastatin (LIPITOR) 40 MG tablet Take 20 mg by mouth daily.  8  . celecoxib (CELEBREX) 200 MG capsule Take 1 capsule by mouth daily.  0  . KRILL OIL PO Take by mouth daily.    . Multiple Vitamin (MULTIVITAMIN) tablet Take 1 tablet by mouth daily.    Marland Kitchen omeprazole (PRILOSEC) 20 MG capsule Take 20 mg by mouth daily.  2  . Na Sulfate-K Sulfate-Mg Sulf 17.5-3.13-1.6 GM/177ML SOLN Take 1 kit by mouth as directed. 1 Bottle 0   No current facility-administered medications for this visit.     Allergies as of  09/01/2018  . (No Known Allergies)    Family History  Problem Relation Age of Onset  . Colon cancer Neg Hx   . Colon polyps Neg Hx     Social History   Socioeconomic History  . Marital status: Divorced    Spouse name: Not on file  . Number of children: Not on file  . Years of education: Not on file  . Highest education level: Not on file  Occupational History  . Not on file  Social Needs  . Financial resource strain: Not on file  . Food insecurity:    Worry: Not on file    Inability: Not on file  . Transportation needs:    Medical: Not on file    Non-medical: Not on file  Tobacco Use  . Smoking status: Former Smoker    Packs/day: 0.50    Years: 30.00    Pack years: 15.00    Types: Cigarettes  . Smokeless tobacco: Former Systems developer    Types: Chew  . Tobacco comment: Is attempting to quit smoking, weaning down now.; Dip is "once in a while."  Substance and Sexual Activity  . Alcohol use: Yes    Alcohol/week: 0.0 standard drinks    Comment: approx 6 pack/week, weekends maybe more.   Marland Kitchen  Drug use: No  . Sexual activity: Not on file  Lifestyle  . Physical activity:    Days per week: Not on file    Minutes per session: Not on file  . Stress: Not on file  Relationships  . Social connections:    Talks on phone: Not on file    Gets together: Not on file    Attends religious service: Not on file    Active member of club or organization: Not on file    Attends meetings of clubs or organizations: Not on file    Relationship status: Not on file  . Intimate partner violence:    Fear of current or ex partner: Not on file    Emotionally abused: Not on file    Physically abused: Not on file    Forced sexual activity: Not on file  Other Topics Concern  . Not on file  Social History Narrative  . Not on file    Review of Systems: Gen: Denies any fever, chills, fatigue, weight loss, lack of appetite.  CV: Denies chest pain, heart palpitations, peripheral edema, syncope.    Resp: Denies shortness of breath at rest or with exertion. Denies wheezing or cough.  GI: see HPI. GU : Denies urinary burning, urinary frequency, urinary hesitancy MS: Denies joint pain, muscle weakness, cramps, or limitation of movement.  Derm: Denies rash, itching, dry skin Psych: Denies depression, anxiety, memory loss, and confusion Heme: Denies bruising, bleeding, and enlarged lymph nodes.  Physical Exam: BP 122/82   Pulse 72   Temp 97.8 F (36.6 C) (Oral)   Ht _0  (1.753 m)   Wt 185 lb 3.2 oz (84 kg)   BMI 27.35 kg/m  General:   Alert and oriented. Pleasant and cooperative. Well-nourished and well-developed.  Head:  Normocephalic and atraumatic. Eyes:  Without icterus, sclera clear and conjunctiva pink.  Ears:  Normal auditory acuity. Nose:  No deformity, discharge,  or lesions. Mouth:  No deformity or lesions, oral mucosa pink.  Lungs:  Clear to auscultation bilaterally. No wheezes, rales, or rhonchi. No distress.  Heart:  S1, S2 present without murmurs appreciated.  Abdomen:  +BS, soft, non-tender and non-distended. No HSM noted. No guarding or rebound. No masses appreciated.  Rectal:  Deferred  Msk:  Symmetrical without gross deformities. Normal posture. Extremities:  Without  edema. Neurologic:  Alert and  oriented x 4 Psych:  Alert and cooperative. Normal mood and affect.

## 2018-09-01 NOTE — Patient Instructions (Signed)
We have arranged a colonoscopy in the near future with Dr. Gala Romney.  GO Pack!  It was a pleasure to see you today. I strive to create trusting relationships with patients to provide genuine, compassionate, and quality care. I value your feedback. If you receive a survey regarding your visit,  I greatly appreciate you taking time to fill this out.   Annitta Needs, PhD, ANP-BC [facility]

## 2018-09-01 NOTE — Assessment & Plan Note (Signed)
52 year old male with history of multiple adenomas in 2016, due for 3-year-surveillance now. No concerning lower or upper GI symptoms.  Proceed with TCS with Dr. Gala Romney in near future: the risks, benefits, and alternatives have been discussed with the patient in detail. The patient states understanding and desires to proceed. Propofol due to history of alcohol

## 2018-09-01 NOTE — H&P (View-Only) (Signed)
Primary Care Physician:  Renee Rival, NP Primary Gastroenterologist:  Dr. Gala Romney   Chief Complaint  Patient presents with  . Colonoscopy    due for tcs    HPI:   Kevin Hill is a 52 y.o. 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  He has no concerning upper or lower GI symptoms. Denies dysphagia, GERD exacerbation, rectal bleeding, constipation, diarrhea, unexplained weight loss or lack of appetite. His only chronic issues that he is dealing with currently is joint pain, arthritis.    Past Medical History:  Diagnosis Date  . Acid reflux   . Arthritis   . Depression   . Headache   . Herniated disc, cervical   . High cholesterol     Past Surgical History:  Procedure Laterality Date  . cervical plates  6237  . COLONOSCOPY  about 10 years ago   in [place]  . COLONOSCOPY WITH PROPOFOL N/A 07/10/2015   Dr. Gala Romney: multiple colonic polyps, colonic diverticulosis (tubular adenomas)  . KNEE SURGERY     right  . NECK INJECTIONS    . NECK SURGERY    . ORTHOPEDIC SURGERY    . POLYPECTOMY N/A 07/10/2015   Procedure: POLYPECTOMY;  Surgeon: Daneil Dolin, MD;  Location: [facility];  Service: Endoscopy;  Laterality: N/A;  cecal, Ascending Colon, Splenic Flexure  . thumb surgery      Current Outpatient Medications  Medication Sig Dispense Refill  . aspirin 81 MG tablet Take 81 mg by mouth daily.    Marland Kitchen atorvastatin (LIPITOR) 40 MG tablet Take 20 mg by mouth daily.  8  . celecoxib (CELEBREX) 200 MG capsule Take 1 capsule by mouth daily.  0  . KRILL OIL PO Take by mouth daily.    . Multiple Vitamin (MULTIVITAMIN) tablet Take 1 tablet by mouth daily.    Marland Kitchen omeprazole (PRILOSEC) 20 MG capsule Take 20 mg by mouth daily.  2  . Na Sulfate-K Sulfate-Mg Sulf 17.5-3.13-1.6 GM/177ML SOLN Take 1 kit by mouth as directed. 1 Bottle 0   No current facility-administered medications for this visit.     Allergies as of  09/01/2018  . (No Known Allergies)    Family History  Problem Relation Age of Onset  . Colon cancer Neg Hx   . Colon polyps Neg Hx     Social History   Socioeconomic History  . Marital status: Divorced    Spouse name: Not on file  . Number of children: Not on file  . Years of education: Not on file  . Highest education level: Not on file  Occupational History  . Not on file  Social Needs  . Financial resource strain: Not on file  . Food insecurity:    Worry: Not on file    Inability: Not on file  . Transportation needs:    Medical: Not on file    Non-medical: Not on file  Tobacco Use  . Smoking status: Former Smoker    Packs/day: 0.50    Years: 30.00    Pack years: 15.00    Types: Cigarettes  . Smokeless tobacco: Former Systems developer    Types: Chew  . Tobacco comment: Is attempting to quit smoking, weaning down now.; Dip is "once in a while."  Substance and Sexual Activity  . Alcohol use: Yes    Alcohol/week: 0.0 standard drinks    Comment: approx 6 pack/week, weekends maybe more.   Marland Kitchen  Drug use: No  . Sexual activity: Not on file  Lifestyle  . Physical activity:    Days per week: Not on file    Minutes per session: Not on file  . Stress: Not on file  Relationships  . Social connections:    Talks on phone: Not on file    Gets together: Not on file    Attends religious service: Not on file    Active member of club or organization: Not on file    Attends meetings of clubs or organizations: Not on file    Relationship status: Not on file  . Intimate partner violence:    Fear of current or ex partner: Not on file    Emotionally abused: Not on file    Physically abused: Not on file    Forced sexual activity: Not on file  Other Topics Concern  . Not on file  Social History Narrative  . Not on file    Review of Systems: Gen: Denies any fever, chills, fatigue, weight loss, lack of appetite.  CV: Denies chest pain, heart palpitations, peripheral edema, syncope.    Resp: Denies shortness of breath at rest or with exertion. Denies wheezing or cough.  GI: see HPI. GU : Denies urinary burning, urinary frequency, urinary hesitancy MS: Denies joint pain, muscle weakness, cramps, or limitation of movement.  Derm: Denies rash, itching, dry skin Psych: Denies depression, anxiety, memory loss, and confusion Heme: Denies bruising, bleeding, and enlarged lymph nodes.  Physical Exam: BP 122/82   Pulse 72   Temp 97.8 F (36.6 C) (Oral)   Ht _0  (1.753 m)   Wt 185 lb 3.2 oz (84 kg)   BMI 27.35 kg/m  General:   Alert and oriented. Pleasant and cooperative. Well-nourished and well-developed.  Head:  Normocephalic and atraumatic. Eyes:  Without icterus, sclera clear and conjunctiva pink.  Ears:  Normal auditory acuity. Nose:  No deformity, discharge,  or lesions. Mouth:  No deformity or lesions, oral mucosa pink.  Lungs:  Clear to auscultation bilaterally. No wheezes, rales, or rhonchi. No distress.  Heart:  S1, S2 present without murmurs appreciated.  Abdomen:  +BS, soft, non-tender and non-distended. No HSM noted. No guarding or rebound. No masses appreciated.  Rectal:  Deferred  Msk:  Symmetrical without gross deformities. Normal posture. Extremities:  Without  edema. Neurologic:  Alert and  oriented x 4 Psych:  Alert and cooperative. Normal mood and affect.

## 2018-09-04 ENCOUNTER — Telehealth: Payer: Self-pay | Admitting: *Deleted

## 2018-09-04 NOTE — Progress Notes (Signed)
CC'D TO PCP °

## 2018-09-04 NOTE — Telephone Encounter (Signed)
Pre-op scheduled for 09/21/18 at 1:45pm. Letter mailed. Lmovm.

## 2018-09-11 MED FILL — CELECOXIB 200 MG CAP: 200 | 30 days supply | Qty: 30 | Fill #0

## 2018-09-14 MED FILL — OMEPRAZOLE 20 MG CPDR: 20 | 30 days supply | Qty: 30 | Fill #0

## 2018-09-19 NOTE — Patient Instructions (Signed)
Kevin Hill  09/19/2018     @PREFPERIOPPHARMACY @   Your procedure is scheduled on  09/28/2018.  Report to [facility] at  745   A.M.  Call this number if you have problems the morning of surgery:  [phone]   Remember:  Do not eat or drink after midnight.  You may drink clear liquids until  ( follow the instructions given to you) .  Clear liquids allowed are:                    Water, Juice (non-citric and without pulp), Carbonated beverages, Clear Tea, Black Coffee only, Plain Jell-O only, Gatorade and Plain Popsicles only    Take these medicines the morning of surgery with A SIP OF WATER  Celebrex, prilosec.    Do not wear jewelry, make-up or nail polish.  Do not wear lotions, powders, or perfumes, or deodorant.  Do not shave 48 hours prior to surgery.  Men may shave face and neck.  Do not bring valuables to the hospital.  [facility] is not responsible for any belongings or valuables.  Contacts, dentures or bridgework may not be worn into surgery.  Leave your suitcase in the car.  After surgery it may be brought to your room.  For patients admitted to the hospital, discharge time will be determined by your treatment team.  Patients discharged the day of surgery will not be allowed to drive home.   Name and phone number of your driver:   family Special instructions:  Follow the diet and prep instructions given to you by Dr Roseanne Kaufman office.  Please read over the following fact sheets that you were given. Anesthesia Post-op Instructions and Care and Recovery After Surgery       Colonoscopy, Adult A colonoscopy is an exam to look at the large intestine. It is done to check for problems, such as:  Lumps (tumors).  Growths (polyps).  Swelling (inflammation).  Bleeding.  What happens before the procedure? Eating and drinking Follow instructions from your doctor about eating and drinking. These instructions may include:  A few days before the  procedure - follow a low-fiber diet. ? Avoid nuts. ? Avoid seeds. ? Avoid dried fruit. ? Avoid raw fruits. ? Avoid vegetables.  1-3 days before the procedure - follow a clear liquid diet. Avoid liquids that have red or purple dye. Drink only clear liquids, such as: ? Clear broth or bouillon. ? Black coffee or tea. ? Clear juice. ? Clear soft drinks or sports drinks. ? Gelatin dessert. ? Popsicles.  On the day of the procedure - do not eat or drink anything during the 2 hours before the procedure.  Bowel prep If you were prescribed an oral bowel prep:  Take it as told by your doctor. Starting the day before your procedure, you will need to drink a lot of liquid. The liquid will cause you to poop (have bowel movements) until your poop is almost clear or light green.  If your skin or butt gets irritated from diarrhea, you may: ? Wipe the area with wipes that have medicine in them, such as adult wet wipes with aloe and vitamin E. ? Put something on your skin that soothes the area, such as petroleum jelly.  If you throw up (vomit) while drinking the bowel prep, take a break for up to 60 minutes. Then begin the bowel prep again. If you keep throwing up  and you cannot take the bowel prep without throwing up, call your doctor.  General instructions  Ask your doctor about changing or stopping your normal medicines. This is important if you take diabetes medicines or blood thinners.  Plan to have someone take you home from the hospital or clinic. What happens during the procedure?  An IV tube may be put into one of your veins.  You will be given medicine to help you relax (sedative).  To reduce your risk of infection: ? Your doctors will wash their hands. ? Your anal area will be washed with soap.  You will be asked to lie on your side with your knees bent.  Your doctor will get a long, thin, flexible tube ready. The tube will have a camera and a light on the end.  The tube will  be put into your anus.  The tube will be gently put into your large intestine.  Air will be delivered into your large intestine to keep it open. You may feel some pressure or cramping.  The camera will be used to take photos.  A small tissue sample may be removed from your body to be looked at under a microscope (biopsy). If any possible problems are found, the tissue will be sent to a lab for testing.  If small growths are found, your doctor may remove them and have them checked for cancer.  The tube that was put into your anus will be slowly removed. The procedure may vary among doctors and hospitals. What happens after the procedure?  Your doctor will check on you often until the medicines you were given have worn off.  Do not drive for 24 hours after the procedure.  You may have a small amount of blood in your poop.  You may pass gas.  You may have mild cramps or bloating in your belly (abdomen).  It is up to you to get the results of your procedure. Ask your doctor, or the department performing the procedure, when your results will be ready. This information is not intended to replace advice given to you by your health care provider. Make sure you discuss any questions you have with your health care provider. Document Released: 01/08/2011 Document Revised: 10/06/2016 Document Reviewed: 02/17/2016 Elsevier Interactive Patient Education  2017 [employer]  Colonoscopy, Adult, Care After This sheet gives you information about how to care for yourself after your procedure. Your health care provider may also give you more specific instructions. If you have problems or questions, contact your health care provider. What can I expect after the procedure? After the procedure, it is common to have:  A small amount of blood in your stool for 24 hours after the procedure.  Some gas.  Mild abdominal cramping or bloating.  Follow these instructions at home: General  instructions   For the first 24 hours after the procedure: ? Do not drive or use machinery. ? Do not sign important documents. ? Do not drink alcohol. ? Do your regular daily activities at a slower pace than normal. ? Eat soft, easy-to-digest foods. ? Rest often.  Take over-the-counter or prescription medicines only as told by your health care provider.  It is up to you to get the results of your procedure. Ask your health care provider, or the department performing the procedure, when your results will be ready. Relieving cramping and bloating  Try walking around when you have cramps or feel bloated.  Apply heat to your abdomen as  told by your health care provider. Use a heat source that your health care provider recommends, such as a moist heat pack or a heating pad. ? Place a towel between your skin and the heat source. ? Leave the heat on for 20-30 minutes. ? Remove the heat if your skin turns bright red. This is especially important if you are unable to feel pain, heat, or cold. You may have a greater risk of getting burned. Eating and drinking  Drink enough fluid to keep your urine clear or pale yellow.  Resume your normal diet as instructed by your health care provider. Avoid heavy or fried foods that are hard to digest.  Avoid drinking alcohol for as long as instructed by your health care provider. Contact a health care provider if:  You have blood in your stool 2-3 days after the procedure. Get help right away if:  You have more than a small spotting of blood in your stool.  You pass large blood clots in your stool.  Your abdomen is swollen.  You have nausea or vomiting.  You have a fever.  You have increasing abdominal pain that is not relieved with medicine. This information is not intended to replace advice given to you by your health care provider. Make sure you discuss any questions you have with your health care provider. Document Released: 07/20/2004  Document Revised: 08/30/2016 Document Reviewed: 02/17/2016 Elsevier Interactive Patient Education  2018 [place] Anesthesia is a term that refers to techniques, procedures, and medicines that help a person stay safe and comfortable during a medical procedure. Monitored anesthesia care, or sedation, is one type of anesthesia. Your anesthesia specialist may recommend sedation if you will be having a procedure that does not require you to be unconscious, such as:  Cataract surgery.  A dental procedure.  A biopsy.  A colonoscopy.  During the procedure, you may receive a medicine to help you relax (sedative). There are three levels of sedation:  Mild sedation. At this level, you may feel awake and relaxed. You will be able to follow directions.  Moderate sedation. At this level, you will be sleepy. You may not remember the procedure.  Deep sedation. At this level, you will be asleep. You will not remember the procedure.  The more medicine you are given, the deeper your level of sedation will be. Depending on how you respond to the procedure, the anesthesia specialist may change your level of sedation or the type of anesthesia to fit your needs. An anesthesia specialist will monitor you closely during the procedure. Let your health care provider know about:  Any allergies you have.  All medicines you are taking, including vitamins, herbs, eye drops, creams, and over-the-counter medicines.  Any use of steroids (by mouth or as a cream).  Any problems you or family members have had with sedatives and anesthetic medicines.  Any blood disorders you have.  Any surgeries you have had.  Any medical conditions you have, such as sleep apnea.  Whether you are pregnant or may be pregnant.  Any use of cigarettes, alcohol, or street drugs. What are the risks? Generally, this is a safe procedure. However, problems may occur, including:  Getting too much  medicine (oversedation).  Nausea.  Allergic reaction to medicines.  Trouble breathing. If this happens, a breathing tube may be used to help with breathing. It will be removed when you are awake and breathing on your own.  Heart trouble.  Lung trouble.  Before the procedure Staying hydrated Follow instructions from your health care provider about hydration, which may include:  Up to 2 hours before the procedure - you may continue to drink clear liquids, such as water, clear fruit juice, black coffee, and plain tea.  Eating and drinking restrictions Follow instructions from your health care provider about eating and drinking, which may include:  8 hours before the procedure - stop eating heavy meals or foods such as meat, fried foods, or fatty foods.  6 hours before the procedure - stop eating light meals or foods, such as toast or cereal.  6 hours before the procedure - stop drinking milk or drinks that contain milk.  2 hours before the procedure - stop drinking clear liquids.  Medicines Ask your health care provider about:  Changing or stopping your regular medicines. This is especially important if you are taking diabetes medicines or blood thinners.  Taking medicines such as aspirin and ibuprofen. These medicines can thin your blood. Do not take these medicines before your procedure if your health care provider instructs you not to.  Tests and exams  You will have a physical exam.  You may have blood tests done to show: ? How well your kidneys and liver are working. ? How well your blood can clot.  General instructions  Plan to have someone take you home from the hospital or clinic.  If you will be going home right after the procedure, plan to have someone with you for 24 hours.  What happens during the procedure?  Your blood pressure, heart rate, breathing, level of pain and overall condition will be monitored.  An IV tube will be inserted into one of your  veins.  Your anesthesia specialist will give you medicines as needed to keep you comfortable during the procedure. This may mean changing the level of sedation.  The procedure will be performed. After the procedure  Your blood pressure, heart rate, breathing rate, and blood oxygen level will be monitored until the medicines you were given have worn off.  Do not drive for 24 hours if you received a sedative.  You may: ? Feel sleepy, clumsy, or nauseous. ? Feel forgetful about what happened after the procedure. ? Have a sore throat if you had a breathing tube during the procedure. ? Vomit. This information is not intended to replace advice given to you by your health care provider. Make sure you discuss any questions you have with your health care provider. Document Released: 09/01/2005 Document Revised: 05/14/2016 Document Reviewed: 03/28/2016 Elsevier Interactive Patient Education  2018 [place], Care After These instructions provide you with information about caring for yourself after your procedure. Your health care provider may also give you more specific instructions. Your treatment has been planned according to current medical practices, but problems sometimes occur. Call your health care provider if you have any problems or questions after your procedure. What can I expect after the procedure? After your procedure, it is common to:  Feel sleepy for several hours.  Feel clumsy and have poor balance for several hours.  Feel forgetful about what happened after the procedure.  Have poor judgment for several hours.  Feel nauseous or vomit.  Have a sore throat if you had a breathing tube during the procedure.  Follow these instructions at home: For at least 24 hours after the procedure:   Do not: ? Participate in activities in which you could fall or become injured. ? Drive. ?  Use heavy machinery. ? Drink alcohol. ? Take sleeping pills or  medicines that cause drowsiness. ? Make important decisions or sign legal documents. ? Take care of children on your own.  Rest. Eating and drinking  Follow the diet that is recommended by your health care provider.  If you vomit, drink water, juice, or soup when you can drink without vomiting.  Make sure you have little or no nausea before eating solid foods. General instructions  Have a responsible adult stay with you until you are awake and alert.  Take over-the-counter and prescription medicines only as told by your health care provider.  If you smoke, do not smoke without supervision.  Keep all follow-up visits as told by your health care provider. This is important. Contact a health care provider if:  You keep feeling nauseous or you keep vomiting.  You feel light-headed.  You develop a rash.  You have a fever. Get help right away if:  You have trouble breathing. This information is not intended to replace advice given to you by your health care provider. Make sure you discuss any questions you have with your health care provider. Document Released: 03/28/2016 Document Revised: 07/28/2016 Document Reviewed: 03/28/2016 Elsevier Interactive Patient Education  [employer].

## 2018-09-21 ENCOUNTER — Encounter (HOSPITAL_COMMUNITY): Payer: Self-pay

## 2018-09-21 ENCOUNTER — Other Ambulatory Visit: Payer: Self-pay

## 2018-09-21 ENCOUNTER — Encounter (HOSPITAL_COMMUNITY)
Admission: RE | Admit: 2018-09-21 | Discharge: 2018-09-21 | Disposition: A | Discharge status: Home / Self Care | Payer: No Typology Code available for payment source | Source: Ambulatory Visit | Attending: Internal Medicine | Admitting: Internal Medicine

## 2018-09-21 DIAGNOSIS — Z01818 Encounter for other preprocedural examination: Secondary | ICD-10-CM | POA: Diagnosis not present

## 2018-09-21 LAB — CBC
HCT: 43.9 % (ref 39.0–52.0)
Hemoglobin: 14.6 g/dL (ref 13.0–17.0)
MCH: 31.3 pg (ref 26.0–34.0)
MCHC: 33.3 g/dL (ref 30.0–36.0)
MCV: 94 fL (ref 78.0–100.0)
Platelets: 248 10*3/uL (ref 150–400)
RBC: 4.67 MIL/uL (ref 4.22–5.81)
RDW: 12.6 % (ref 11.5–15.5)
WBC: 7.7 10*3/uL (ref 4.0–10.5)

## 2018-09-21 LAB — BASIC METABOLIC PANEL
Anion gap: 8 (ref 5–15)
BUN: 18 mg/dL (ref 6–20)
CHLORIDE: 104 mmol/L (ref 98–111)
CO2: 26 mmol/L (ref 22–32)
CREATININE: 1.01 mg/dL (ref 0.61–1.24)
Calcium: 9.3 mg/dL (ref 8.9–10.3)
GFR calc Af Amer: >60 mL/min (ref >60)
GFR calc non Af Amer: >60 mL/min (ref >60)
Glucose, Bld: 97 mg/dL (ref 70–99)
Potassium: 3.2 mmol/L — ABNORMAL LOW (ref 3.5–5.1)
Sodium: 138 mmol/L (ref 135–145)

## 2018-09-22 ENCOUNTER — Telehealth: Payer: Self-pay

## 2018-09-22 NOTE — Progress Notes (Signed)
Left Vm on both numbers for a return call.  

## 2018-09-22 NOTE — Telephone Encounter (Signed)
Opened in error

## 2018-09-22 NOTE — Progress Notes (Signed)
Pt is aware and Rx called to [facility] in [place], [place].

## 2018-09-28 ENCOUNTER — Encounter (HOSPITAL_COMMUNITY): Payer: Self-pay | Admitting: *Deleted

## 2018-09-28 ENCOUNTER — Ambulatory Visit (HOSPITAL_COMMUNITY)
Admission: RE | Admit: 2018-09-28 | Discharge: 2018-09-28 | Disposition: A | Discharge status: Home / Self Care | Payer: No Typology Code available for payment source | Source: Ambulatory Visit | Attending: Internal Medicine | Admitting: Internal Medicine

## 2018-09-28 ENCOUNTER — Ambulatory Visit (HOSPITAL_COMMUNITY): Payer: No Typology Code available for payment source | Admitting: Anesthesiology

## 2018-09-28 ENCOUNTER — Encounter (HOSPITAL_COMMUNITY)
Admission: RE | Disposition: A | Discharge status: Home / Self Care | Payer: Self-pay | Source: Ambulatory Visit | Attending: Internal Medicine

## 2018-09-28 DIAGNOSIS — K5730 Diverticulosis of large intestine without perforation or abscess without bleeding: Secondary | ICD-10-CM | POA: Insufficient documentation

## 2018-09-28 DIAGNOSIS — Z7982 Long term (current) use of aspirin: Secondary | ICD-10-CM | POA: Diagnosis not present

## 2018-09-28 DIAGNOSIS — M1990 Unspecified osteoarthritis, unspecified site: Secondary | ICD-10-CM | POA: Insufficient documentation

## 2018-09-28 DIAGNOSIS — Z79899 Other long term (current) drug therapy: Secondary | ICD-10-CM | POA: Diagnosis not present

## 2018-09-28 DIAGNOSIS — Z86010 Personal history of colonic polyps: Secondary | ICD-10-CM | POA: Insufficient documentation

## 2018-09-28 DIAGNOSIS — K219 Gastro-esophageal reflux disease without esophagitis: Secondary | ICD-10-CM | POA: Diagnosis not present

## 2018-09-28 DIAGNOSIS — F329 Major depressive disorder, single episode, unspecified: Secondary | ICD-10-CM | POA: Insufficient documentation

## 2018-09-28 DIAGNOSIS — E7800 Pure hypercholesterolemia, unspecified: Secondary | ICD-10-CM | POA: Diagnosis not present

## 2018-09-28 DIAGNOSIS — Z87891 Personal history of nicotine dependence: Secondary | ICD-10-CM | POA: Insufficient documentation

## 2018-09-28 DIAGNOSIS — Z1211 Encounter for screening for malignant neoplasm of colon: Secondary | ICD-10-CM | POA: Insufficient documentation

## 2018-09-28 HISTORY — PX: COLONOSCOPY WITH PROPOFOL: SHX5780

## 2018-09-28 SURGERY — COLONOSCOPY WITH PROPOFOL
Anesthesia: General

## 2018-09-28 MED ORDER — CHLORHEXIDINE GLUCONATE CLOTH 2 % EX PADS: 6.0000 | MEDICATED_PAD | Freq: Once | CUTANEOUS | Status: DC

## 2018-09-28 MED ORDER — HYDROCODONE-ACETAMINOPHEN 7.5-325 MG PO TABS: 1.0000 | ORAL_TABLET | Freq: Once | ORAL | Status: DC | PRN

## 2018-09-28 MED ORDER — MIDAZOLAM HCL 5 MG/5ML IJ SOLN
INTRAMUSCULAR | Status: DC | PRN
  Administered 2018-09-28: 2 mg via INTRAVENOUS

## 2018-09-28 MED ORDER — HYDROMORPHONE HCL 1 MG/ML IJ SOLN: 0.2500 mg | INTRAMUSCULAR | Status: DC | PRN

## 2018-09-28 MED ORDER — PROPOFOL 500 MG/50ML IV EMUL
INTRAVENOUS | Status: DC | PRN
  Administered 2018-09-28: 150 ug/kg/min via INTRAVENOUS

## 2018-09-28 MED ORDER — STERILE WATER FOR IRRIGATION IR SOLN
Status: DC | PRN
  Administered 2018-09-28: 1.5 mL

## 2018-09-28 MED ORDER — LACTATED RINGERS IV SOLN
INTRAVENOUS | Status: DC
  Administered 2018-09-28: 1000 mL via INTRAVENOUS

## 2018-09-28 MED ORDER — PROPOFOL 10 MG/ML IV BOLUS
INTRAVENOUS | Status: AC
  Filled 2018-09-28: qty 40

## 2018-09-28 MED ORDER — MIDAZOLAM HCL 2 MG/2ML IJ SOLN
INTRAMUSCULAR | Status: AC
  Filled 2018-09-28: qty 2

## 2018-09-28 MED ORDER — PROMETHAZINE HCL 25 MG/ML IJ SOLN: 6.2500 mg | INTRAMUSCULAR | Status: DC | PRN

## 2018-09-28 MED ORDER — SODIUM CHLORIDE 0.9% FLUSH
INTRAVENOUS | Status: AC
  Filled 2018-09-28: qty 10

## 2018-09-28 MED ORDER — MIDAZOLAM HCL 2 MG/2ML IJ SOLN: 0.5000 mg | Freq: Once | INTRAMUSCULAR | Status: DC | PRN

## 2018-09-28 NOTE — Transfer of Care (Signed)
Immediate Anesthesia Transfer of Care Note  Patient: Cheryll Dessert  Procedure(s) Performed: COLONOSCOPY WITH PROPOFOL (N/A )  Patient Location: PACU  Anesthesia Type:MAC  Level of Consciousness: awake and patient cooperative  Airway & Oxygen Therapy: Patient Spontanous Breathing and Patient connected to nasal cannula oxygen  Post-op Assessment: Report given to RN, Post -op Vital signs reviewed and stable and Patient moving all extremities  Post vital signs: Reviewed and stable  Last Vitals:  Vitals Value Taken Time  BP    Temp    Pulse    Resp    SpO2      Last Pain:  Vitals:   09/28/18 0955  TempSrc:   PainSc: 0-No pain      Patients Stated Pain Goal: 7 (21/62/44 6950)  Complications: No apparent anesthesia complications

## 2018-09-28 NOTE — Discharge Instructions (Signed)
Colonoscopy, Adult, Care After This sheet gives you information about how to care for yourself after your procedure. Your doctor may also give you more specific instructions. If you have problems or questions, call your doctor. Follow these instructions at home: General instructions   For the first 24 hours after the procedure: ? Do not drive or use machinery. ? Do not sign important documents. ? Do not drink alcohol. ? Do your daily activities more slowly than normal. ? Eat foods that are soft and easy to digest. ? Rest often.  Take over-the-counter or prescription medicines only as told by your doctor.  It is up to you to get the results of your procedure. Ask your doctor, or the department performing the procedure, when your results will be ready. To help cramping and bloating:  Try walking around.  Put heat on your belly (abdomen) as told by your doctor. Use a heat source that your doctor recommends, such as a moist heat pack or a heating pad. ? Put a towel between your skin and the heat source. ? Leave the heat on for 20-30 minutes. ? Remove the heat if your skin turns bright red. This is especially important if you cannot feel pain, heat, or cold. You can get burned. Eating and drinking  Drink enough fluid to keep your pee (urine) clear or pale yellow.  Return to your normal diet as told by your doctor. Avoid heavy or fried foods that are hard to digest.  Avoid drinking alcohol for as long as told by your doctor. Contact a doctor if:  You have blood in your poop (stool) 2-3 days after the procedure. Get help right away if:  You have more than a small amount of blood in your poop.  You see large clumps of tissue (blood clots) in your poop.  Your belly is swollen.  You feel sick to your stomach (nauseous).  You throw up (vomit).  You have a fever.  You have belly pain that gets worse, and medicine does not help your pain. This information is not intended to  replace advice given to you by your health care provider. Make sure you discuss any questions you have with your health care provider. Document Released: 01/08/2011 Document Revised: 08/30/2016 Document Reviewed: 08/30/2016 Elsevier Interactive Patient Education  2017 [employer].      Diverticulosis information provided per Dr. Gala Romney No change in medications, patient may resume current medications per Dr. Gala Romney Repeat colonoscopy in 5 years per Dr. Gala Romney No polyps        Monitored Anesthesia Care, Care After These instructions provide you with information about caring for yourself after your procedure. Your health care provider may also give you more specific instructions. Your treatment has been planned according to current medical practices, but problems sometimes occur. Call your health care provider if you have any problems or questions after your procedure. What can I expect after the procedure? After your procedure, it is common to:  Feel sleepy for several hours.  Feel clumsy and have poor balance for several hours.  Feel forgetful about what happened after the procedure.  Have poor judgment for several hours.  Feel nauseous or vomit.  Have a sore throat if you had a breathing tube during the procedure.  Follow these instructions at home: For at least 24 hours after the procedure:   Do not: ? Participate in activities in which you could fall or become injured. ? Drive. ? Use heavy machinery. ? Drink alcohol. ?  Take sleeping pills or medicines that cause drowsiness. ? Make important decisions or sign legal documents. ? Take care of children on your own.  Rest. Eating and drinking  Follow the diet that is recommended by your health care provider.  If you vomit, drink water, juice, or soup when you can drink without vomiting.  Make sure you have little or no nausea before eating solid foods. General instructions  Have a responsible adult stay with you  until you are awake and alert.  Take over-the-counter and prescription medicines only as told by your health care provider.  If you smoke, do not smoke without supervision.  Keep all follow-up visits as told by your health care provider. This is important. Contact a health care provider if:  You keep feeling nauseous or you keep vomiting.  You feel light-headed.  You develop a rash.  You have a fever. Get help right away if:  You have trouble breathing. This information is not intended to replace advice given to you by your health care provider. Make sure you discuss any questions you have with your health care provider. Document Released: 03/28/2016 Document Revised: 07/28/2016 Document Reviewed: 03/28/2016 Elsevier Interactive Patient Education  [employer].    Diverticulosis information provided  Repeat colonoscopy in 5 years.

## 2018-09-28 NOTE — Progress Notes (Signed)
Patient may resume current medications, no medication changes, No polyps, Diverticulosis information provided to patient and repeat colonoscopy in 5 years. V. Jenetta Downer Dr. Santo Held.  Added to discharge instructions.

## 2018-09-28 NOTE — Anesthesia Preprocedure Evaluation (Signed)
Anesthesia Evaluation  Patient identified by MRN, date of birth, ID band Patient awake    Reviewed: Allergy & Precautions, NPO status , Patient's Chart, lab work & pertinent test results  Airway Mallampati: II  TM Distance: >3 FB Neck ROM: Full    Dental no notable dental hx. (+) Teeth Intact, Partial Upper   Pulmonary neg pulmonary ROS, former smoker,    Pulmonary exam normal breath sounds clear to auscultation       Cardiovascular Exercise Tolerance: Good negative cardio ROS Normal cardiovascular examI Rhythm:Regular Rate:Normal     Neuro/Psych  Headaches, Depression negative psych ROS   GI/Hepatic Neg liver ROS, GERD  Medicated and Controlled,  Endo/Other  negative endocrine ROS  Renal/GU negative Renal ROS  negative genitourinary   Musculoskeletal  (+) Arthritis , Osteoarthritis,    Abdominal   Peds negative pediatric ROS (+)  Hematology negative hematology ROS (+)   Anesthesia Other Findings   Reproductive/Obstetrics negative OB ROS                             Anesthesia Physical Anesthesia Plan  ASA: II  Anesthesia Plan: General   Post-op Pain Management:    Induction: Intravenous  PONV Risk Score and Plan:   Airway Management Planned: Nasal Cannula and Simple Face Mask  Additional Equipment:   Intra-op Plan:   Post-operative Plan:   Informed Consent: I have reviewed the patients History and Physical, chart, labs and discussed the procedure including the risks, benefits and alternatives for the proposed anesthesia with the patient or authorized representative who has indicated his/her understanding and acceptance.   Dental advisory given  Plan Discussed with: CRNA  Anesthesia Plan Comments:         Anesthesia Quick Evaluation

## 2018-09-28 NOTE — Anesthesia Postprocedure Evaluation (Signed)
Anesthesia Post Note  Patient: Kevin Hill  Procedure(s) Performed: COLONOSCOPY WITH PROPOFOL (N/A )  Patient location during evaluation: PACU Anesthesia Type: MAC Level of consciousness: awake and patient cooperative Pain management: pain level controlled Vital Signs Assessment: post-procedure vital signs reviewed and stable Respiratory status: spontaneous breathing, nonlabored ventilation and respiratory function stable Cardiovascular status: blood pressure returned to baseline Postop Assessment: no apparent nausea or vomiting Anesthetic complications: no     Last Vitals:  Vitals:   09/28/18 0935 09/28/18 1018  BP:  107/72  Pulse:  66  Resp: 19 18  Temp:  36.4 C  SpO2: 94% 98%    Last Pain:  Vitals:   09/28/18 1018  TempSrc:   PainSc: (P) Asleep                 Brok Stocking J

## 2018-09-28 NOTE — Op Note (Signed)
[facility] Patient Name: Kevin Hill Procedure Date: 09/28/2018 9:27 AM MRN: [id number] Date of Birth: November 23, 1966 Attending MD: Norvel Richards , MD CSN: [id number] Age: 52 Admit Type: Outpatient Procedure:                Colonoscopy Indications:              High risk colon cancer surveillance: Personal                            history of colonic polyps Providers:                Norvel Richards, MD, Charlsie Quest. Theda Sers RN, RN,                            Aram Candela Referring MD:              Medicines:                Propofol per Anesthesia Complications:            No immediate complications. Estimated Blood Loss:     Estimated blood loss: none. Procedure:                Pre-Anesthesia Assessment:                           - Prior to the procedure, a History and Physical                            was performed, and patient medications and                            allergies were reviewed. The patient's tolerance of                            previous anesthesia was also reviewed. The risks                            and benefits of the procedure and the sedation                            options and risks were discussed with the patient.                            All questions were answered, and informed consent                            was obtained. Prior Anticoagulants: The patient has                            taken no previous anticoagulant or antiplatelet                            agents. ASA Grade Assessment: II - A patient with  mild systemic disease. After reviewing the risks                            and benefits, the patient was deemed in                            satisfactory condition to undergo the procedure.                           After obtaining informed consent, the colonoscope                            was passed under direct vision. Throughout the                            procedure, the patient's blood  pressure, pulse, and                            oxygen saturations were monitored continuously. The                            [id number]-[id number] ([id number]) scope was introduced through                            the and advanced to the the cecum, identified by                            appendiceal orifice and ileocecal valve. The                            colonoscopy was performed without difficulty. The                            patient tolerated the procedure well. The quality                            of the bowel preparation was adequate. The                            ileocecal valve, appendiceal orifice, and rectum                            were photographed. The entire colon was well                            visualized. Scope In: 9:59:54 AM Scope Out: 10:11:15 AM Scope Withdrawal Time: 0 hours 8 minutes 35 seconds  Total Procedure Duration: 0 hours 11 minutes 21 seconds  Findings:      Multiple small-mouthed diverticula were found in the sigmoid colon and       descending colon.      The exam was otherwise without abnormality on direct and retroflexion       views. Impression:               - Diverticulosis in the sigmoid  colon and in the                            descending colon.                           - The examination was otherwise normal on direct                            and retroflexion views.                           - No specimens collected. Moderate Sedation:      Moderate (conscious) sedation was personally administered by an       anesthesia professional. The following parameters were monitored: oxygen       saturation, heart rate, blood pressure, respiratory rate, EKG, adequacy       of pulmonary ventilation, and response to care. Total physician       intraservice time was 19 minutes. Recommendation:           - Patient has a contact number available for                            emergencies. The signs and symptoms of potential                             delayed complications were discussed with the                            patient. Return to normal activities tomorrow.                            Written discharge instructions were provided to the                            patient.                           - Resume previous diet.                           - Continue present medications.                           - Repeat colonoscopy in 5 years for surveillance.                           - Return to GI office PRN. Procedure Code(s):        --- Professional ---                           [phone], Colonoscopy, flexible; diagnostic, including                            collection of specimen(s) by brushing or washing,  when performed (separate procedure) Diagnosis Code(s):        --- Professional ---                           Z86.010, Personal history of colonic polyps                           K57.30, Diverticulosis of large intestine without                            perforation or abscess without bleeding CPT copyright 2018 American Medical [employer]. All rights reserved. The codes documented in this report are preliminary and upon coder review may  be revised to meet current compliance requirements. Cristopher Estimable. Winter Jocelyn, MD Norvel Richards, MD 09/28/2018 10:17:04 AM This report has been signed electronically. Number of Addenda: 0

## 2018-09-28 NOTE — Interval H&P Note (Signed)
History and Physical Interval Note:  09/28/2018 8:17 AM  Kevin Hill  has presented today for surgery, with the diagnosis of history polyps  The various methods of treatment have been discussed with the patient and family. After consideration of risks, benefits and other options for treatment, the patient has consented to  Procedure(s) with comments: COLONOSCOPY WITH PROPOFOL (N/A) - 9:15am as a surgical intervention .  The patient's history has been reviewed, patient examined, no change in status, stable for surgery.  I have reviewed the patient's chart and labs.  Questions were answered to the patient's satisfaction.     No change.  Surveillance colonoscopy per plan.  The risks, benefits, limitations, alternatives and imponderables have been reviewed with the patient. Questions have been answered. All parties are agreeable.   Manus Rudd

## 2018-10-04 ENCOUNTER — Encounter (HOSPITAL_COMMUNITY): Payer: Self-pay | Admitting: Internal Medicine

## 2018-10-19 MED FILL — OMEPRAZOLE 20 MG CPDR: 20 | 30 days supply | Qty: 30 | Fill #1

## 2018-10-22 ENCOUNTER — Telehealth: Payer: No Typology Code available for payment source | Admitting: Family

## 2018-10-22 DIAGNOSIS — R05 Cough: Secondary | ICD-10-CM | POA: Diagnosis not present

## 2018-10-22 DIAGNOSIS — R059 Cough, unspecified: Secondary | ICD-10-CM

## 2018-10-22 MED ORDER — BENZONATATE 100 MG PO CAPS: 100.0000 mg | ORAL_CAPSULE | Freq: Three times a day (TID) | ORAL | 0 refills | Status: DC | PRN

## 2018-10-22 MED ORDER — PREDNISONE 10 MG (21) PO TBPK: ORAL_TABLET | ORAL | 0 refills | Status: DC

## 2018-10-22 NOTE — Progress Notes (Signed)

## 2018-11-13 MED FILL — CELECOXIB 200 MG CAP: 200 | 30 days supply | Qty: 30 | Fill #0

## 2018-11-20 MED FILL — OMEPRAZOLE 20 MG CPDR: 20 | 30 days supply | Qty: 30 | Fill #2

## 2018-12-25 MED FILL — OMEPRAZOLE 20 MG CPDR: 20 | 90 days supply | Qty: 90 | Fill #0

## 2018-12-28 ENCOUNTER — Telehealth: Payer: No Typology Code available for payment source | Admitting: Physician Assistant

## 2018-12-28 DIAGNOSIS — B9789 Other viral agents as the cause of diseases classified elsewhere: Secondary | ICD-10-CM | POA: Diagnosis not present

## 2018-12-28 DIAGNOSIS — J019 Acute sinusitis, unspecified: Secondary | ICD-10-CM

## 2018-12-28 MED ORDER — IPRATROPIUM BROMIDE 0.03 % NA SOLN: 2.0000 | Freq: Two times a day (BID) | NASAL | 0 refills | Status: DC

## 2018-12-28 MED ORDER — BENZONATATE 100 MG PO CAPS: 100.0000 mg | ORAL_CAPSULE | Freq: Three times a day (TID) | ORAL | 0 refills | Status: DC | PRN

## 2018-12-28 NOTE — Progress Notes (Signed)

## 2018-12-28 NOTE — Addendum Note (Signed)
Addended by: Brunetta Jeans on: 12/28/2018 03:43 PM   Modules accepted: Orders

## 2018-12-30 MED ORDER — AMOXICILLIN-POT CLAVULANATE 875-125 MG PO TABS: 1.0000 | ORAL_TABLET | Freq: Two times a day (BID) | ORAL | 0 refills | Status: DC

## 2018-12-30 NOTE — Addendum Note (Signed)
Addended by: Chevis Pretty on: 12/30/2018 11:41 AM   Modules accepted: Orders

## 2018-12-30 NOTE — Progress Notes (Signed)

## 2019-02-26 MED FILL — ATORVASTATIN 40 MG TABLET: 40 | 90 days supply | Qty: 90 | Fill #1

## 2019-03-21 MED FILL — OMEPRAZOLE 20 MG CPDR: 20 | 90 days supply | Qty: 90 | Fill #0

## 2019-07-04 ENCOUNTER — Other Ambulatory Visit: Payer: Self-pay | Admitting: Physician Assistant

## 2019-07-04 ENCOUNTER — Other Ambulatory Visit (HOSPITAL_COMMUNITY): Payer: Self-pay | Admitting: Physician Assistant

## 2019-07-04 DIAGNOSIS — K4090 Unilateral inguinal hernia, without obstruction or gangrene, not specified as recurrent: Secondary | ICD-10-CM

## 2019-07-16 MED FILL — OMEPRAZOLE 20 MG CAP: 20 | 90 days supply | Qty: 90 | Fill #1

## 2019-07-24 ENCOUNTER — Ambulatory Visit (HOSPITAL_COMMUNITY)
Admission: RE | Admit: 2019-07-24 | Discharge: 2019-07-24 | Disposition: A | Discharge status: Home / Self Care | Payer: No Typology Code available for payment source | Source: Ambulatory Visit | Attending: Physician Assistant | Admitting: Physician Assistant

## 2019-07-24 ENCOUNTER — Other Ambulatory Visit: Payer: Self-pay

## 2019-07-24 DIAGNOSIS — K4090 Unilateral inguinal hernia, without obstruction or gangrene, not specified as recurrent: Secondary | ICD-10-CM | POA: Insufficient documentation

## 2019-07-25 ENCOUNTER — Other Ambulatory Visit: Payer: Self-pay

## 2019-08-14 ENCOUNTER — Ambulatory Visit: Payer: Self-pay | Admitting: General Surgery

## 2019-08-29 ENCOUNTER — Ambulatory Visit: Payer: Self-pay | Admitting: Surgery

## 2019-08-29 NOTE — H&P (Signed)
Kevin Hill Documented: 08/29/2019 3:49 PM Location: [facility] Patient #: [id number] DOB: [birth year] Married / Language: Kevin Hill / Race: White Male  History of Present Illness Adin Hector MD; 08/29/2019 6:32 PM) The patient is a 53 year old male who presents with an inguinal hernia. Note for "Inguinal hernia": ` ` ` Patient sent for surgical consultation at the request of Rowan Blase PA/John Hilma Favors. [facility]  Chief Complaint: Left inguinal hernia. Right groin pain with probable inguinal hernias well ` ` The patient is a pleasant active male. Has noted some left groin bulging with some aching and occasional sharp pains with activity. More noticeable when he's been active at work and recently doing some housework and moving. Has had some intermittent right groin pains as well. Had a CT scan which noted definite left and probable right inguinal hernias. Request patient sent to Dr. Donne Hazel but referred to myself given my expertise in laparoscopic bilateral inguinal hernia repairs. Patient denies much abdominal surgery. He's had a fair amount of orthopedic surgery for joint and spine issues. Tolerated as well as. He is not a smoker. Not diabetic. Otherwise physically active. Does some light to moderate activity at his job. He does not the pain seems to worsen he's been rather active. Also if he has to sit for long periods of time while driving. No problems with urination or defecation. Moves his bowels every day. No history of infections or MRSA.  (Review of systems as stated in this history (HPI) or in the review of systems. Otherwise all other 12 point ROS are negative) ` ` `   Past Surgical History Sallyanne Kuster, [place]; 08/29/2019 3:51 PM) Colon Polyp Removal - Colonoscopy Knee Surgery Bilateral. Shoulder Surgery Bilateral. Spinal Surgery - Neck  Diagnostic Studies History Sallyanne Kuster, CMA; 08/29/2019 3:51 PM) Colonoscopy within last  year  Allergies Sallyanne Kuster, CMA; 08/29/2019 3:52 PM) No Known Drug Allergies [08/29/2019]: Allergies Reconciled  Medication History Sallyanne Kuster, [place]; 08/29/2019 3:53 PM) Atorvastatin Calcium (40MG  Tablet, Oral) Active. Omeprazole (20MG  Capsule DR, Oral) Active. Multi-Vitamin (Oral) Active. Aspirin (81MG  Tablet, Oral) Active. Medications Reconciled  Social History Sallyanne Kuster, [place]; 08/29/2019 3:51 PM) Alcohol use Occasional alcohol use. Caffeine use Carbonated beverages. No drug use Tobacco use Former smoker.  Family History Sallyanne Kuster, [place]; 08/29/2019 3:51 PM) Family history unknown First Degree Relatives  Other Problems Sallyanne Kuster, [place]; 08/29/2019 3:51 PM) Arthritis Back Pain Inguinal Hernia     Review of Systems Sallyanne Kuster CMA; 08/29/2019 3:51 PM) General Not Present- Appetite Loss, Chills, Fatigue, Fever, Night Sweats, Weight Gain and Weight Loss. Skin Not Present- Change in Wart/Mole, Dryness, Hives, Jaundice, New Lesions, Non-Healing Wounds, Rash and Ulcer. HEENT Present- Wears glasses/contact lenses. Not Present- Earache, Hearing Loss, Hoarseness, Nose Bleed, Oral Ulcers, Ringing in the Ears, Seasonal Allergies, Sinus Pain, Sore Throat, Visual Disturbances and Yellow Eyes. Respiratory Not Present- Bloody sputum, Chronic Cough, Difficulty Breathing, Snoring and Wheezing. Breast Not Present- Breast Mass, Breast Pain, Nipple Discharge and Skin Changes. Cardiovascular Not Present- Chest Pain, Difficulty Breathing Lying Down, Leg Cramps, Palpitations, Rapid Heart Rate, Shortness of Breath and Swelling of Extremities. Gastrointestinal Present- Abdominal Pain. Not Present- Bloating, Bloody Stool, Change in Bowel Habits, Chronic diarrhea, Constipation, Difficulty Swallowing, Excessive gas, Gets full quickly at meals, Hemorrhoids, Indigestion, Nausea, Rectal Pain and Vomiting. Male Genitourinary Not Present- Blood in Urine, Change in Urinary Stream, Frequency,  Impotence, Nocturia, Painful Urination, Urgency and Urine Leakage. Musculoskeletal Present- Back Pain, Joint Pain and Joint Stiffness.  Not Present- Muscle Pain, Muscle Weakness and Swelling of Extremities. Neurological Not Present- Decreased Memory, Fainting, Headaches, Numbness, Seizures, Tingling, Tremor, Trouble walking and Weakness. Psychiatric Not Present- Anxiety, Bipolar, Change in Sleep Pattern, Depression, Fearful and Frequent crying. Endocrine Not Present- Cold Intolerance, Excessive Hunger, Hair Changes, Heat Intolerance, Hot flashes and New Diabetes. Hematology Not Present- Blood Thinners, Easy Bruising, Excessive bleeding, Gland problems, HIV and Persistent Infections.  Vitals Sallyanne Kuster CMA; 08/29/2019 3:54 PM) 08/29/2019 3:53 PM Weight: 177 lb Height: 69in Body Surface Area: 1.96 m Body Mass Index: 26.14 kg/m  Temp.: 47F  Pulse: 64 (Regular)  P.OX: 97% (Room air) BP: 119/78 (Sitting, Left Arm, Standard)        Physical Exam Adin Hector MD; 08/29/2019 4:12 PM)  General Mental Status-Alert. General Appearance-Not in acute distress, Not Sickly. Orientation-Oriented X3. Hydration-Well hydrated. Voice-Normal.  Integumentary Global Assessment Upon inspection and palpation of skin surfaces of the - Axillae: non-tender, no inflammation or ulceration, no drainage. and Distribution of scalp and body hair is normal. General Characteristics Temperature - normal warmth is noted.  Head and Neck Head-normocephalic, atraumatic with no lesions or palpable masses. Face Global Assessment - atraumatic, no absence of expression. Neck Global Assessment - no abnormal movements, no bruit auscultated on the right, no bruit auscultated on the left, no decreased range of motion, non-tender. Trachea-midline. Thyroid Gland Characteristics - non-tender.  Eye Eyeball - Left-Extraocular movements intact, No Nystagmus - Left. Eyeball - Right-Extraocular  movements intact, No Nystagmus - Right. Cornea - Left-No Hazy - Left. Cornea - Right-No Hazy - Right. Sclera/Conjunctiva - Left-No scleral icterus, No Discharge - Left. Sclera/Conjunctiva - Right-No scleral icterus, No Discharge - Right. Pupil - Left-Direct reaction to light normal. Pupil - Right-Direct reaction to light normal.  ENMT Ears Pinna - Left - no drainage observed, no generalized tenderness observed. Pinna - Right - no drainage observed, no generalized tenderness observed. Nose and Sinuses External Inspection of the Nose - no destructive lesion observed. Inspection of the nares - Left - quiet respiration. Inspection of the nares - Right - quiet respiration. Mouth and Throat Lips - Upper Lip - no fissures observed, no pallor noted. Lower Lip - no fissures observed, no pallor noted. Nasopharynx - no discharge present. Oral Cavity/Oropharynx - Tongue - no dryness observed. Oral Mucosa - no cyanosis observed. Hypopharynx - no evidence of airway distress observed.  Chest and Lung Exam Inspection Movements - Normal and Symmetrical. Accessory muscles - No use of accessory muscles in breathing. Palpation Palpation of the chest reveals - Non-tender. Auscultation Breath sounds - Normal and Clear.  Cardiovascular Auscultation Rhythm - Regular. Murmurs & Other Heart Sounds - Auscultation of the heart reveals - No Murmurs and No Systolic Clicks.  Abdomen Inspection Inspection of the abdomen reveals - No Visible peristalsis and No Abnormal pulsations. Umbilicus - No Bleeding, No Urine drainage. Palpation/Percussion Palpation and Percussion of the abdomen reveal - Soft, Non Tender, No Rebound tenderness, No Rigidity (guarding) and No Cutaneous hyperesthesia. Note: Abdomen soft. Nontender. Not distended. No umbilical or incisional hernias. No guarding.  Male Genitourinary Sexual Maturity Tanner 5 - Adult hair pattern and Adult penile size and shape. Note: Obvious  left groin bulging consistent with hernia. Right inguinal hernia on Valsalva as well otherwise normal external genitalia circumcised.  Peripheral Vascular Upper Extremity Inspection - Left - No Cyanotic nailbeds - Left, Not Ischemic. Inspection - Right - No Cyanotic nailbeds - Right, Not Ischemic.  Neurologic Neurologic evaluation reveals -normal attention span and  ability to concentrate, able to name objects and repeat phrases. Appropriate fund of knowledge , normal sensation and normal coordination. Mental Status Affect - not angry, not paranoid. Cranial Nerves-Normal Bilaterally. Gait-Normal.  Neuropsychiatric Mental status exam performed with findings of-able to articulate well with normal speech/language, rate, volume and coherence, thought content normal with ability to perform basic computations and apply abstract reasoning and no evidence of hallucinations, delusions, obsessions or homicidal/suicidal ideation.  Musculoskeletal Global Assessment Spine, Ribs and Pelvis - no instability, subluxation or laxity. Right Upper Extremity - no instability, subluxation or laxity.  Lymphatic Head & Neck  General Head & Neck Lymphatics: Bilateral - Description - No Localized lymphadenopathy. Axillary  General Axillary Region: Bilateral - Description - No Localized lymphadenopathy. Femoral & Inguinal  Generalized Femoral & Inguinal Lymphatics: Left - Description - No Localized lymphadenopathy. Right - Description - No Localized lymphadenopathy.    Assessment & Plan Adin Hector MD; 08/29/2019 4:14 PM)  BILATERAL INGUINAL HERNIA WITHOUT OBSTRUCTION OR GANGRENE, RECURRENCE NOT SPECIFIED (K40.20) Impression: Obvious left greater than right inguinal hernias with increasing pain and symptoms  I think he would benefit from surgical repair. Good candidate for laparoscopic approach. 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 If it becomes unbearable, he may do it sooner. I did caution him we'll need at least 2 if not 3 weeks before coming back to light duty in 6 weeks for unrestricted. He feels optimistic that aching do light duty stuff in a few weeks.  Current Plans You are being scheduled for surgery- Our schedulers will call you.  You should hear from our office's scheduling department within 5 working days about the location, date, and time of surgery. We try to make accommodations for patient's preferences in scheduling surgery, but sometimes the OR schedule or the surgeon's schedule prevents [place] from making those accommodations.  If you have not heard from our office [phone]) in 5 working days, call the office and ask for your surgeon's nurse.  If you have other questions about your diagnosis, plan, or surgery, call the office and ask for your surgeon's nurse.  Written instructions provided The anatomy & physiology of the abdominal wall and pelvic floor was discussed. The pathophysiology of hernias in the inguinal and pelvic region was discussed. Natural history risks such as progressive enlargement, pain, incarceration, and strangulation was discussed. Contributors to complications such as smoking, obesity, diabetes, prior surgery, etc were discussed.  I feel the risks of no intervention will lead to serious problems that outweigh the operative risks; therefore, I recommended surgery to reduce and repair the hernia. I explained laparoscopic techniques with possible need for an open approach. I noted usual use of mesh to patch and/or buttress hernia repair  Risks such as bleeding, infection, abscess, need for further treatment, heart attack, death, and other risks were discussed. I noted a good likelihood this will help address the problem. Goals of post-operative recovery were discussed as well. Possibility that this will not correct all symptoms was explained. I stressed the importance  of low-impact activity, aggressive pain control, avoiding constipation, & not pushing through pain to minimize risk of post-operative chronic pain or injury. Possibility of reherniation was discussed. We will work to minimize complications.  An educational handout further explaining the pathology & treatment options was given as well. Questions were answered. The patient expresses understanding & wishes to proceed with surgery.  Pt Education - Pamphlet  Given - Laparoscopic Hernia Repair: discussed with patient and provided information. Pt Education - [facility] Pain Control (Keniesha Adderly) Pt Education - [facility] Hernia Post-Op HCI (Manasvi Dickard): discussed with patient and provided information. Pt Education - [facility] Mesh education: discussed with patient and provided information.  Adin Hector, MD, FACS, MASCRS Gastrointestinal and Minimally Invasive Surgery    [address] [place], [place] [address] [phone] Main / Paging [phone] Fax

## 2019-09-20 MED FILL — ATORVASTATIN 20 MG TABLET: 20 | 90 days supply | Qty: 90 | Fill #0

## 2019-10-29 MED FILL — OMEPRAZOLE 20 MG CAP: 20 | 90 days supply | Qty: 90 | Fill #2

## 2019-11-17 ENCOUNTER — Encounter (HOSPITAL_COMMUNITY): Payer: Self-pay

## 2019-11-17 ENCOUNTER — Ambulatory Visit (HOSPITAL_COMMUNITY)
Admission: EM | Admit: 2019-11-17 | Discharge: 2019-11-17 | Disposition: A | Discharge status: Home / Self Care | Payer: No Typology Code available for payment source | Attending: Emergency Medicine | Admitting: Emergency Medicine

## 2019-11-17 ENCOUNTER — Other Ambulatory Visit: Payer: Self-pay

## 2019-11-17 DIAGNOSIS — S81811A Laceration without foreign body, right lower leg, initial encounter: Secondary | ICD-10-CM

## 2019-11-17 MED ORDER — LIDOCAINE-EPINEPHRINE (PF) 2 %-1:200000 IJ SOLN
INTRAMUSCULAR | Status: AC
  Filled 2019-11-17: qty 20

## 2019-11-17 MED ORDER — HYDROCODONE-ACETAMINOPHEN 5-325 MG PO TABS
ORAL_TABLET | ORAL | Status: AC
  Filled 2019-11-17: qty 1

## 2019-11-17 MED ORDER — HYDROCODONE-ACETAMINOPHEN 5-325 MG PO TABS: 1.0000 | ORAL_TABLET | Freq: Four times a day (QID) | ORAL | 0 refills | Status: DC | PRN

## 2019-11-17 MED ORDER — HYDROCODONE-ACETAMINOPHEN 5-325 MG PO TABS
1.0000 | ORAL_TABLET | Freq: Once | ORAL | Status: AC
  Administered 2019-11-17: 1 via ORAL

## 2019-11-17 NOTE — Discharge Instructions (Signed)

## 2019-11-17 NOTE — ED Triage Notes (Signed)
Pt reports stepping through a hole in a deck this afternoon, causing laceration to right anterior lower leg.  Full thickness laceration down to bone noted without active bleeding.  Discussed with Rondel Oh, NP - OK for pt to be seen in [facility].  Dressing replaced; pt updated on wait; pt verbalized understanding.

## 2019-11-18 DIAGNOSIS — S81811A Laceration without foreign body, right lower leg, initial encounter: Secondary | ICD-10-CM | POA: Diagnosis not present

## 2019-11-18 NOTE — ED Provider Notes (Signed)
Kevin Hill    CSN: [id number] Arrival date & time: 11/17/19  1621      History   Chief Complaint Chief Complaint  Patient presents with  . Laceration    HPI Kevin Hill is a 53 y.o. male history of GERD, hyperlipidemia, arthritis, presenting today for evaluation of laceration.  Patient sustained laceration to right anterior lower leg earlier today.  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  States his last tetanus was 6 months ago.  He denies any difficulty bending his knee or ankle.  Does state that he has a weird tingling sensation in his foot, but denies pain or difficulty moving his foot or toes.  HPI  Past Medical History:  Diagnosis Date  . Acid reflux   . Arthritis   . Depression   . Headache   . Herniated disc, cervical   . High cholesterol     Patient Active Problem List   Diagnosis Date Noted  . History of colonic polyps   . Diverticulosis of colon without hemorrhage   . Encounter for screening colonoscopy 06/18/2015    Past Surgical History:  Procedure Laterality Date  . cervical plates  QA348G  . COLONOSCOPY  about 10 years ago   in [place]  . COLONOSCOPY WITH PROPOFOL N/A 07/10/2015   Dr. Gala Romney: multiple colonic polyps, colonic diverticulosis (tubular adenomas)  . COLONOSCOPY WITH PROPOFOL N/A 09/28/2018   Procedure: COLONOSCOPY WITH PROPOFOL;  Surgeon: Daneil Dolin, MD;  Location: [facility] SUITE;  Service: Endoscopy;  Laterality: N/A;  9:15am  . JOINT REPLACEMENT Right    total knee  . KNEE SURGERY Bilateral   . NECK INJECTIONS    . NECK SURGERY    . ORTHOPEDIC SURGERY    . POLYPECTOMY N/A 07/10/2015   Procedure: POLYPECTOMY;  Surgeon: Daneil Dolin, MD;  Location: [facility] ORS;  Service: Endoscopy;  Laterality: N/A;  cecal, Ascending Colon, Splenic Flexure  . thumb surgery Right        Home Medications    Prior to Admission medications    Medication Sig Start Date End Date Taking? Authorizing Provider  aspirin 81 MG tablet Take 81 mg by mouth daily.    [provider]  atorvastatin (LIPITOR) 40 MG tablet Take 20 mg by mouth daily. 08/30/18   [provider]  HYDROcodone-acetaminophen (NORCO/VICODIN) 5-325 MG tablet Take 1 tablet by mouth every 6 (six) hours as needed for severe pain. 11/17/19   Wieters, Hallie C, PA-C  Krill Oil 500 MG CAPS Take 500 mg by mouth daily.     [provider]  Multiple Vitamin (MULTIVITAMIN) tablet Take 1 tablet by mouth daily.    [provider]  omeprazole (PRILOSEC) 20 MG capsule Take 20 mg by mouth daily. 08/08/18   [provider]    Family History Family History  Problem Relation Age of Onset  . Colon cancer Neg Hx   . Colon polyps Neg Hx     Social History Social History   Tobacco Use  . Smoking status: Former Smoker    Packs/day: 0.50    Years: 30.00    Pack years: 15.00    Types: Cigarettes    Quit date: 09/22/2015    Years since quitting: 4.1  . Smokeless tobacco: Former Systems developer    Types: Chew    Quit date: 06/21/2018  . Tobacco comment: Is  attempting to quit smoking, weaning down now.; Dip is "once in a while."  Substance Use Topics  . Alcohol use: Yes    Alcohol/week: 0.0 standard drinks    Comment: approx 6 pack/week, weekends maybe more.   . Drug use: No     Allergies   Patient has no known allergies.   Review of Systems Review of Systems  Constitutional: Negative for fatigue and fever.  Eyes: Negative for redness, itching and visual disturbance.  Respiratory: Negative for shortness of breath.   Cardiovascular: Negative for chest pain and leg swelling.  Gastrointestinal: Negative for nausea and vomiting.  Musculoskeletal: Negative for arthralgias and myalgias.  Skin: Positive for wound. Negative for color change and rash.  Neurological: Negative for dizziness, syncope, weakness, light-headedness and headaches.      Physical Exam Triage Vital Signs ED Triage Vitals  Enc Vitals Group     BP 11/17/19 1650 (!) 144/79     Pulse Rate 11/17/19 1650 78     Resp 11/17/19 1650 18     Temp 11/17/19 1650 98.1 F (36.7 C)     Temp Source 11/17/19 1650 Oral     SpO2 11/17/19 1650 100 %     Weight --      Height --      Head Circumference --      Peak Flow --      Pain Score 11/17/19 1652 8     Pain Loc --      Pain Edu? --      Excl. in [place]? --    No data found.  Updated Vital Signs BP (!) 144/79 (BP Location: Left Arm)   Pulse 78   Temp 98.1 F (36.7 C) (Oral)   Resp 18   SpO2 100%   Visual Acuity Right Eye Distance:   Left Eye Distance:   Bilateral Distance:    Right Eye Near:   Left Eye Near:    Bilateral Near:     Physical Exam Vitals signs and nursing note reviewed.  Constitutional:      Appearance: He is well-developed.     Comments: No acute distress  HENT:     Head: Normocephalic and atraumatic.     Nose: Nose normal.  Eyes:     Conjunctiva/sclera: Conjunctivae normal.  Neck:     Musculoskeletal: Neck supple.  Cardiovascular:     Rate and Rhythm: Normal rate.  Pulmonary:     Effort: Pulmonary effort is normal. No respiratory distress.  Abdominal:     General: There is no distension.  Musculoskeletal: Normal range of motion.  Skin:    General: Skin is warm and dry.     Comments: See pictures below, large semicircular laceration to anterior right lower leg extending through subcutaneous tissue, fascia overlying anterior tibia visible, but does not appear damaged  Neurological:     Mental Status: He is alert and oriented to person, place, and time.          UC Treatments / Results  Labs (all labs ordered are listed, but only abnormal results are displayed) Labs Reviewed - No data to display  EKG   Radiology No results found.  Procedures Laceration Repair  Date/Time: 11/18/2019 8:38 AM Performed by: Wieters, [place] C, PA-C Authorized by: Wieters,  Elesa Hacker, PA-C   Consent:    Consent obtained:  Verbal   Consent given by:  Patient   Risks discussed:  Infection, pain and poor cosmetic result   Alternatives discussed:  No treatment Anesthesia (see MAR for exact dosages):    Anesthesia method:  Local infiltration   Local anesthetic:  Lidocaine 2% WITH epi Laceration details:    Location:  Leg   Leg location:  R lower leg   Length (cm):  4   Depth (mm):  4 Repair type:    Repair type:  Intermediate Pre-procedure details:    Preparation:  Patient was prepped and draped in usual sterile fashion Exploration:    Hemostasis achieved with:  Direct pressure   Wound exploration: wound explored through full range of motion     Wound extent: no foreign bodies/material noted, no tendon damage noted and no underlying fracture noted     Contaminated: no   Treatment:    Area cleansed with:  Shur-Clens and soap and water   Amount of cleaning:  Standard   Irrigation solution:  Sterile saline   Irrigation volume:  500   Irrigation method:  Syringe   Visualized foreign bodies/material removed: no   Subcutaneous repair:    Suture size:  3-0   Wound subcutaneous closure material used: monocryl.   Suture technique:  Horizontal mattress   Number of sutures:  3 Skin repair:    Repair method:  Sutures   Suture size:  4-0   Suture material:  Prolene   Suture technique:  Horizontal mattress   Number of sutures:  7 Approximation:    Approximation:  Close Post-procedure details:    Dressing:  Non-adherent dressing   Patient tolerance of procedure:  Tolerated well, no immediate complications   (including critical care time)  Medications Ordered in UC Medications  HYDROcodone-acetaminophen (NORCO/VICODIN) 5-325 MG per tablet 1 tablet (1 tablet Oral Given 11/17/19 1745)  HYDROcodone-acetaminophen (NORCO/VICODIN) 5-325 MG per tablet (has no administration in time range)  lidocaine-EPINEPHrine (XYLOCAINE W/EPI) 2 %-1:200000 (PF) injection (has  no administration in time range)    Initial Impression / Assessment and Plan / UC Course  I have reviewed the triage vital signs and the nursing notes.  Pertinent labs & imaging results that were available during my care of the patient were reviewed by me and considered in my medical decision making (see chart for details).     Deep sutures were placed to help with tension on more superficial sutures as well as to provide protection over exposed fascia/bone area.  No immediate complications during repair.  Discussed wound care, keeping clean and dry.  Monitor for signs of infection.  Tetanus up-to-date.  Provided hydrocodone to use for severe pain.  Discussed risks associated with this.  Registry checked.  Initially sent to other pharmacies, but these were closed, resent to [facility] on [address], called to previous pharmacies and left voicemails to discontinue other prescriptions.  Discussed strict return precautions. Patient verbalized understanding and is agreeable with plan.  Final Clinical Impressions(s) / UC Diagnoses   Final diagnoses:  Laceration of right lower extremity, initial encounter     Discharge Instructions     WOUND CARE Please return in 7-10 days to have your stitches/staples removed or sooner if you have concerns. Marland Kitchen Keep area clean and dry for 24 hours. Do not remove bandage, if applied. . After 24 hours, remove bandage and wash wound gently with mild soap and warm water. Reapply a new bandage after cleaning wound, if directed. . Continue daily cleansing with soap and water until stitches/staples are removed. . Do not apply any ointments or creams to the wound while stitches/staples are in place, as this may  cause delayed healing. . Notify the office if you experience any of the following signs of infection: Swelling, redness, pus drainage, streaking, fever >101.0 F . Notify the office if you experience excessive bleeding that does not stop after 15-20 minutes of  constant, firm pressure.   ED Prescriptions    Medication Sig Dispense Auth. Provider   HYDROcodone-acetaminophen (NORCO/VICODIN) 5-325 MG tablet  (Status: Discontinued) Take 1 tablet by mouth every 6 (six) hours as needed for severe pain. 10 tablet Wieters, Hallie C, PA-C   HYDROcodone-acetaminophen (NORCO/VICODIN) 5-325 MG tablet  (Status: Discontinued) Take 1 tablet by mouth every 6 (six) hours as needed for severe pain. 10 tablet Wieters, Hallie C, PA-C   HYDROcodone-acetaminophen (NORCO/VICODIN) 5-325 MG tablet Take 1 tablet by mouth every 6 (six) hours as needed for severe pain. 10 tablet Wieters, [place] C, PA-C     I have reviewed the PDMP during this encounter.   Janith Lima, [place] 11/18/19 [phone]

## 2019-11-26 ENCOUNTER — Other Ambulatory Visit (HOSPITAL_COMMUNITY): Payer: No Typology Code available for payment source

## 2019-12-31 ENCOUNTER — Ambulatory Visit: Payer: Self-pay | Admitting: Surgery

## 2019-12-31 ENCOUNTER — Other Ambulatory Visit: Payer: Self-pay

## 2019-12-31 ENCOUNTER — Other Ambulatory Visit (HOSPITAL_COMMUNITY)
Admission: RE | Admit: 2019-12-31 | Discharge: 2019-12-31 | Disposition: A | Discharge status: Home / Self Care | Payer: No Typology Code available for payment source | Source: Ambulatory Visit | Attending: Surgery | Admitting: Surgery

## 2020-01-30 ENCOUNTER — Encounter (HOSPITAL_BASED_OUTPATIENT_CLINIC_OR_DEPARTMENT_OTHER): Payer: Self-pay | Admitting: Surgery

## 2020-01-30 ENCOUNTER — Other Ambulatory Visit: Payer: Self-pay

## 2020-01-30 NOTE — Progress Notes (Signed)
Spoke w/ via phone for pre-op interview---[facility] needs dos----       NONE        COVID test ------02-04-2020 Arrive at -------1045 AM 02-06-2020 NPO after ------MIDNIGHT Medications to take morning of surgery -----ATORVASTATIN Diabetic medication -----N/A Patient Special Instructions ----- Pre-Op special Istructions -----PATIENT STOPPING A 81 MG ASA A FEW DAYS BEFORE SURGERY PER DR GROSS Patient verbalized understanding of instructions that were given at this phone interview. Patient denies shortness of breath, chest pain, fever, cough a this phone interview.

## 2020-02-04 ENCOUNTER — Other Ambulatory Visit: Payer: Self-pay

## 2020-02-04 ENCOUNTER — Other Ambulatory Visit (HOSPITAL_COMMUNITY)
Admission: RE | Admit: 2020-02-04 | Discharge: 2020-02-04 | Disposition: A | Discharge status: Home / Self Care | Payer: No Typology Code available for payment source | Source: Ambulatory Visit | Attending: Surgery | Admitting: Surgery

## 2020-02-04 DIAGNOSIS — Z20822 Contact with and (suspected) exposure to covid-19: Secondary | ICD-10-CM | POA: Diagnosis not present

## 2020-02-04 DIAGNOSIS — Z01812 Encounter for preprocedural laboratory examination: Secondary | ICD-10-CM | POA: Insufficient documentation

## 2020-02-04 LAB — SARS CORONAVIRUS 2 (TAT 6-24 HRS): SARS Coronavirus 2: NEGATIVE

## 2020-02-05 NOTE — Anesthesia Preprocedure Evaluation (Signed)
Anesthesia Evaluation  Patient identified by MRN, date of birth, ID band Patient awake    Reviewed: Allergy & Precautions, NPO status , Patient's Chart, lab work & pertinent test results  Airway Mallampati: II  TM Distance: >3 FB Neck ROM: Full    Dental no notable dental hx.    Pulmonary former smoker,  15 pack year history    Pulmonary exam normal breath sounds clear to auscultation       Cardiovascular negative cardio ROS Normal cardiovascular exam Rhythm:Regular Rate:Normal     Neuro/Psych  Headaches, PSYCHIATRIC DISORDERS Depression    GI/Hepatic Neg liver ROS, GERD  Controlled and Medicated,  Endo/Other  negative endocrine ROS  Renal/GU negative Renal ROS  negative genitourinary   Musculoskeletal  (+) Arthritis , Osteoarthritis,    Abdominal   Peds negative pediatric ROS (+)  Hematology negative hematology ROS (+)   Anesthesia Other Findings Bilateral inguinal hernia  HLD  Reproductive/Obstetrics negative OB ROS                             Anesthesia Physical Anesthesia Plan  ASA: II  Anesthesia Plan: General   Post-op Pain Management:    Induction: Intravenous  PONV Risk Score and Plan: 3 and Ondansetron, Dexamethasone, Midazolam and Treatment may vary due to age or medical condition  Airway Management Planned: Oral ETT  Additional Equipment: None  Intra-op Plan:   Post-operative Plan: Extubation in OR  Informed Consent: I have reviewed the patients History and Physical, chart, labs and discussed the procedure including the risks, benefits and alternatives for the proposed anesthesia with the patient or authorized representative who has indicated his/her understanding and acceptance.     Dental advisory given  Plan Discussed with: CRNA  Anesthesia Plan Comments:         Anesthesia Quick Evaluation

## 2020-02-06 ENCOUNTER — Ambulatory Visit (HOSPITAL_COMMUNITY)
Admission: RE | Admit: 2020-02-06 | Discharge: 2020-02-06 | Disposition: A | Discharge status: Home / Self Care | Payer: No Typology Code available for payment source | Attending: Surgery | Admitting: Surgery

## 2020-02-06 ENCOUNTER — Encounter (HOSPITAL_BASED_OUTPATIENT_CLINIC_OR_DEPARTMENT_OTHER)
Admission: RE | Disposition: A | Discharge status: Home / Self Care | Payer: Self-pay | Source: Home / Self Care | Attending: Surgery

## 2020-02-06 ENCOUNTER — Ambulatory Visit (HOSPITAL_BASED_OUTPATIENT_CLINIC_OR_DEPARTMENT_OTHER): Payer: No Typology Code available for payment source | Admitting: Anesthesiology

## 2020-02-06 ENCOUNTER — Encounter (HOSPITAL_BASED_OUTPATIENT_CLINIC_OR_DEPARTMENT_OTHER): Payer: Self-pay | Admitting: Surgery

## 2020-02-06 DIAGNOSIS — E785 Hyperlipidemia, unspecified: Secondary | ICD-10-CM | POA: Insufficient documentation

## 2020-02-06 DIAGNOSIS — F329 Major depressive disorder, single episode, unspecified: Secondary | ICD-10-CM | POA: Insufficient documentation

## 2020-02-06 DIAGNOSIS — K219 Gastro-esophageal reflux disease without esophagitis: Secondary | ICD-10-CM | POA: Diagnosis not present

## 2020-02-06 DIAGNOSIS — Z87891 Personal history of nicotine dependence: Secondary | ICD-10-CM | POA: Insufficient documentation

## 2020-02-06 DIAGNOSIS — K4020 Bilateral inguinal hernia, without obstruction or gangrene, not specified as recurrent: Secondary | ICD-10-CM | POA: Diagnosis present

## 2020-02-06 DIAGNOSIS — Z79899 Other long term (current) drug therapy: Secondary | ICD-10-CM | POA: Insufficient documentation

## 2020-02-06 DIAGNOSIS — M1990 Unspecified osteoarthritis, unspecified site: Secondary | ICD-10-CM | POA: Insufficient documentation

## 2020-02-06 DIAGNOSIS — Z7982 Long term (current) use of aspirin: Secondary | ICD-10-CM | POA: Insufficient documentation

## 2020-02-06 HISTORY — DX: Bilateral inguinal hernia, without obstruction or gangrene, not specified as recurrent: K40.20

## 2020-02-06 HISTORY — PX: INGUINAL HERNIA REPAIR: SHX194

## 2020-02-06 SURGERY — REPAIR, HERNIA, INGUINAL, BILATERAL, ADULT
Anesthesia: General | Site: Abdomen | Laterality: Bilateral

## 2020-02-06 MED ORDER — OXYCODONE HCL 5 MG/5ML PO SOLN
5.0000 mg | Freq: Once | ORAL | Status: AC | PRN
  Filled 2020-02-06: qty 5

## 2020-02-06 MED ORDER — CEFAZOLIN SODIUM-DEXTROSE 2-4 GM/100ML-% IV SOLN
2.0000 g | INTRAVENOUS | Status: AC
  Administered 2020-02-06: 11:00:00 2 g via INTRAVENOUS
  Filled 2020-02-06: qty 100

## 2020-02-06 MED ORDER — GABAPENTIN 300 MG PO CAPS
300.0000 mg | ORAL_CAPSULE | ORAL | Status: AC
  Administered 2020-02-06: 10:00:00 300 mg via ORAL
  Filled 2020-02-06: qty 1

## 2020-02-06 MED ORDER — DEXAMETHASONE SODIUM PHOSPHATE 10 MG/ML IJ SOLN
INTRAMUSCULAR | Status: DC | PRN
  Administered 2020-02-06 (×2): 5 mg via INTRAVENOUS

## 2020-02-06 MED ORDER — CEFAZOLIN SODIUM-DEXTROSE 2-4 GM/100ML-% IV SOLN
INTRAVENOUS | Status: AC
  Filled 2020-02-06: qty 100

## 2020-02-06 MED ORDER — ACETAMINOPHEN 500 MG PO TABS
ORAL_TABLET | ORAL | Status: AC
  Filled 2020-02-06: qty 2

## 2020-02-06 MED ORDER — LACTATED RINGERS IV SOLN
INTRAVENOUS | Status: DC
  Filled 2020-02-06: qty 1000

## 2020-02-06 MED ORDER — LIDOCAINE 2% (20 MG/ML) 5 ML SYRINGE
INTRAMUSCULAR | Status: DC | PRN
  Administered 2020-02-06: 60 mg via INTRAVENOUS

## 2020-02-06 MED ORDER — PROMETHAZINE HCL 25 MG/ML IJ SOLN
6.2500 mg | INTRAMUSCULAR | Status: DC | PRN
  Filled 2020-02-06: qty 1

## 2020-02-06 MED ORDER — FENTANYL CITRATE (PF) 100 MCG/2ML IJ SOLN
INTRAMUSCULAR | Status: AC
  Filled 2020-02-06: qty 2

## 2020-02-06 MED ORDER — ONDANSETRON HCL 4 MG/2ML IJ SOLN
INTRAMUSCULAR | Status: DC | PRN
  Administered 2020-02-06: 4 mg via INTRAVENOUS

## 2020-02-06 MED ORDER — GABAPENTIN 300 MG PO CAPS
ORAL_CAPSULE | ORAL | Status: AC
  Filled 2020-02-06: qty 1

## 2020-02-06 MED ORDER — OXYCODONE HCL 5 MG PO TABS
ORAL_TABLET | ORAL | Status: AC
  Filled 2020-02-06: qty 1

## 2020-02-06 MED ORDER — CELECOXIB 400 MG PO CAPS
400.0000 mg | ORAL_CAPSULE | ORAL | Status: AC
  Administered 2020-02-06: 10:00:00 400 mg via ORAL
  Filled 2020-02-06: qty 1

## 2020-02-06 MED ORDER — OXYCODONE HCL 5 MG PO TABS
5.0000 mg | ORAL_TABLET | Freq: Once | ORAL | Status: AC | PRN
  Administered 2020-02-06: 14:00:00 5 mg via ORAL
  Filled 2020-02-06: qty 1

## 2020-02-06 MED ORDER — MIDAZOLAM HCL 2 MG/2ML IJ SOLN
INTRAMUSCULAR | Status: AC
  Filled 2020-02-06: qty 2

## 2020-02-06 MED ORDER — PHENYLEPHRINE HCL (PRESSORS) 10 MG/ML IV SOLN
INTRAVENOUS | Status: DC | PRN
  Administered 2020-02-06: 80 ug via INTRAVENOUS

## 2020-02-06 MED ORDER — KETOROLAC TROMETHAMINE 30 MG/ML IJ SOLN
30.0000 mg | Freq: Once | INTRAMUSCULAR | Status: DC | PRN
  Filled 2020-02-06: qty 1

## 2020-02-06 MED ORDER — MIDAZOLAM HCL 2 MG/2ML IJ SOLN
INTRAMUSCULAR | Status: DC | PRN
  Administered 2020-02-06: 2 mg via INTRAVENOUS

## 2020-02-06 MED ORDER — CELECOXIB 200 MG PO CAPS
ORAL_CAPSULE | ORAL | Status: AC
  Filled 2020-02-06: qty 2

## 2020-02-06 MED ORDER — CHLORHEXIDINE GLUCONATE CLOTH 2 % EX PADS
6.0000 | MEDICATED_PAD | Freq: Once | CUTANEOUS | Status: DC
  Filled 2020-02-06: qty 6

## 2020-02-06 MED ORDER — HYDROMORPHONE HCL 1 MG/ML IJ SOLN
0.2500 mg | INTRAMUSCULAR | Status: DC | PRN
  Administered 2020-02-06 (×3): 0.5 mg via INTRAVENOUS
  Filled 2020-02-06: qty 0.5

## 2020-02-06 MED ORDER — ROCURONIUM BROMIDE 10 MG/ML (PF) SYRINGE
PREFILLED_SYRINGE | INTRAVENOUS | Status: AC
  Filled 2020-02-06: qty 10

## 2020-02-06 MED ORDER — BUPIVACAINE-EPINEPHRINE (PF) 0.5% -1:200000 IJ SOLN
INTRAMUSCULAR | Status: DC | PRN
  Administered 2020-02-06: 40 mL

## 2020-02-06 MED ORDER — ROCURONIUM BROMIDE 10 MG/ML (PF) SYRINGE
PREFILLED_SYRINGE | INTRAVENOUS | Status: DC | PRN
  Administered 2020-02-06: 50 mg via INTRAVENOUS
  Administered 2020-02-06: 10 mg via INTRAVENOUS

## 2020-02-06 MED ORDER — HYDROMORPHONE HCL 1 MG/ML IJ SOLN
INTRAMUSCULAR | Status: AC
  Filled 2020-02-06: qty 1

## 2020-02-06 MED ORDER — LIDOCAINE 2% (20 MG/ML) 5 ML SYRINGE
INTRAMUSCULAR | Status: AC
  Filled 2020-02-06: qty 5

## 2020-02-06 MED ORDER — BUPIVACAINE LIPOSOME 1.3 % IJ SUSP
INTRAMUSCULAR | Status: DC | PRN
  Administered 2020-02-06: 20 mL

## 2020-02-06 MED ORDER — SODIUM CHLORIDE (PF) 0.9 % IJ SOLN
INTRAMUSCULAR | Status: DC | PRN
  Administered 2020-02-06: 20 mL

## 2020-02-06 MED ORDER — TRAMADOL HCL 50 MG PO TABS: 50.0000 mg | ORAL_TABLET | Freq: Four times a day (QID) | ORAL | 0 refills | Status: DC | PRN

## 2020-02-06 MED ORDER — PROPOFOL 10 MG/ML IV BOLUS
INTRAVENOUS | Status: DC | PRN
  Administered 2020-02-06: 200 mg via INTRAVENOUS

## 2020-02-06 MED ORDER — FENTANYL CITRATE (PF) 100 MCG/2ML IJ SOLN
INTRAMUSCULAR | Status: DC | PRN
  Administered 2020-02-06 (×2): 50 ug via INTRAVENOUS
  Administered 2020-02-06: 100 ug via INTRAVENOUS

## 2020-02-06 MED ORDER — SUGAMMADEX SODIUM 200 MG/2ML IV SOLN
INTRAVENOUS | Status: DC | PRN
  Administered 2020-02-06: 165 mg via INTRAVENOUS

## 2020-02-06 MED ORDER — ACETAMINOPHEN 500 MG PO TABS
1000.0000 mg | ORAL_TABLET | ORAL | Status: AC
  Administered 2020-02-06: 10:00:00 1000 mg via ORAL
  Filled 2020-02-06: qty 2

## 2020-02-06 MED ORDER — MEPERIDINE HCL 25 MG/ML IJ SOLN
6.2500 mg | INTRAMUSCULAR | Status: DC | PRN
  Filled 2020-02-06: qty 1

## 2020-02-06 MED ORDER — ACETAMINOPHEN 500 MG PO TABS
1000.0000 mg | ORAL_TABLET | Freq: Once | ORAL | Status: DC
  Filled 2020-02-06: qty 2

## 2020-02-06 MED FILL — traMADol HCL 50 MG TABS: 50 | 3 days supply | Qty: 20 | Fill #0

## 2020-02-06 SURGICAL SUPPLY — 49 items
CABLE HIGH FREQUENCY MONO STRZ (ELECTRODE) ×3 IMPLANT
CANISTER SUCT 3000ML PPV (MISCELLANEOUS) IMPLANT
CHLORAPREP W/TINT 26 (MISCELLANEOUS) ×3 IMPLANT
COVER WAND RF STERILE (DRAPES) ×3 IMPLANT
DECANTER SPIKE VIAL GLASS SM (MISCELLANEOUS) ×3 IMPLANT
DERMABOND ADVANCED (GAUZE/BANDAGES/DRESSINGS) ×2
DERMABOND ADVANCED .7 DNX12 (GAUZE/BANDAGES/DRESSINGS) ×1 IMPLANT
DEVICE SECURE STRAP 25 ABSORB (INSTRUMENTS) IMPLANT
DRAPE WARM FLUID 44X44 (DRAPES) ×3 IMPLANT
DRSG TEGADERM 2-3/8X2-3/4 SM (GAUZE/BANDAGES/DRESSINGS) ×3 IMPLANT
DRSG TEGADERM 4X4.75 (GAUZE/BANDAGES/DRESSINGS) ×3 IMPLANT
ELECT REM PT RETURN 9FT ADLT (ELECTROSURGICAL) ×3
ELECTRODE REM PT RTRN 9FT ADLT (ELECTROSURGICAL) ×1 IMPLANT
GAUZE SPONGE 2X2 8PLY STRL LF (GAUZE/BANDAGES/DRESSINGS) ×1 IMPLANT
GLOVE BIO SURGEON STRL SZ 6.5 (GLOVE) ×2 IMPLANT
GLOVE BIO SURGEON STRL SZ7 (GLOVE) ×3 IMPLANT
GLOVE BIO SURGEONS STRL SZ 6.5 (GLOVE) ×1
GLOVE BIOGEL PI IND STRL 7.0 (GLOVE) ×1 IMPLANT
GLOVE BIOGEL PI IND STRL 7.5 (GLOVE) ×1 IMPLANT
GLOVE BIOGEL PI INDICATOR 7.0 (GLOVE) ×2
GLOVE BIOGEL PI INDICATOR 7.5 (GLOVE) ×2
GLOVE ECLIPSE 8.0 STRL XLNG CF (GLOVE) ×3 IMPLANT
GLOVE INDICATOR 8.0 STRL GRN (GLOVE) ×3 IMPLANT
GOWN STRL REUS W/TWL XL LVL3 (GOWN DISPOSABLE) ×3 IMPLANT
IRRIG SUCT STRYKERFLOW 2 WTIP (MISCELLANEOUS)
IRRIGATION SUCT STRKRFLW 2 WTP (MISCELLANEOUS) IMPLANT
KIT TURNOVER CYSTO (KITS) ×3 IMPLANT
MANIFOLD NEPTUNE II (INSTRUMENTS) IMPLANT
MESH ULTRAPRO 6X6 15CM15CM (Mesh General) ×9 IMPLANT
NEEDLE HYPO 22GX1.5 SAFETY (NEEDLE) ×3 IMPLANT
NS IRRIG 500ML POUR BTL (IV SOLUTION) ×3 IMPLANT
PACK BASIN DAY SURGERY FS (CUSTOM PROCEDURE TRAY) ×3 IMPLANT
PAD POSITIONING PINK XL (MISCELLANEOUS) ×3 IMPLANT
SCISSORS LAP 5X35 DISP (ENDOMECHANICALS) ×3 IMPLANT
SET TUBE SMOKE EVAC HIGH FLOW (TUBING) ×3 IMPLANT
SLEEVE ADV FIXATION 5X100MM (TROCAR) ×3 IMPLANT
SPONGE GAUZE 2X2 8PLY STER LF (GAUZE/BANDAGES/DRESSINGS) ×3
SPONGE GAUZE 2X2 8PLY STRL LF (GAUZE/BANDAGES/DRESSINGS) ×6 IMPLANT
SPONGE GAUZE 2X2 STER 10/PKG (GAUZE/BANDAGES/DRESSINGS) ×2
SUT MNCRL AB 4-0 PS2 18 (SUTURE) ×3 IMPLANT
SUT VIC AB 2-0 SH 27 (SUTURE)
SUT VIC AB 2-0 SH 27XBRD (SUTURE) IMPLANT
SUT VICRYL 0 UR6 27IN ABS (SUTURE) IMPLANT
TOWEL OR 17X26 10 PK STRL BLUE (TOWEL DISPOSABLE) ×3 IMPLANT
TRAY DSU PREP LF (CUSTOM PROCEDURE TRAY) ×3 IMPLANT
TRAY LAPAROSCOPIC (CUSTOM PROCEDURE TRAY) ×3 IMPLANT
TROCAR ADV FIXATION 5X100MM (TROCAR) ×3 IMPLANT
TROCAR XCEL BLUNT TIP 100MML (ENDOMECHANICALS) ×3 IMPLANT
WATER STERILE IRR 500ML POUR (IV SOLUTION) ×3 IMPLANT

## 2020-02-06 NOTE — H&P (Signed)
Kevin Hill Documented: 08/29/2019 3:49 PM  DOB: 1966/03/12 Married / Language: English / Race: White Male  ` ` Patient sent for surgical consultation at the request of Rowan Blase PA/John Hilma Favors. [facility]  Chief Complaint: Left inguinal hernia. Right groin pain with probable inguinal hernias well ` ` The patient is a pleasant active male. Has noted some left groin bulging with some aching and occasional sharp pains with activity. More noticeable when he's been active at work and recently doing some housework and moving. Has had some intermittent right groin pains as well. Had a CT scan which noted definite left and probable right inguinal hernias. Request patient sent to Dr. Donne Hazel but referred to myself given my expertise in laparoscopic bilateral inguinal hernia repairs. Patient denies much abdominal surgery. He's had a fair amount of orthopedic surgery for joint and spine issues. Tolerated as well as. He is not a smoker. Not diabetic. Otherwise physically active. Does some light to moderate activity at his job. He does not the pain seems to worsen he's been rather active. Also if he has to sit for long periods of time while driving. No problems with urination or defecation. Moves his bowels every day. No history of infections or MRSA.  (Review of systems as stated in this history (HPI) or in the review of systems. Otherwise all other 12 point ROS are negative) ` ` `   Past Surgical History Sallyanne Kuster, [place]; 08/29/2019 3:51 PM) Colon Polyp Removal - Colonoscopy Knee Surgery Bilateral. Shoulder Surgery Bilateral. Spinal Surgery - Neck  Diagnostic Studies History Sallyanne Kuster, CMA; 08/29/2019 3:51 PM) Colonoscopy within last year  Allergies Sallyanne Kuster, CMA; 08/29/2019 3:52 PM) No Known Drug Allergies [08/29/2019]: Allergies Reconciled  Medication History Sallyanne Kuster, [place]; 08/29/2019 3:53 PM) Atorvastatin Calcium (40MG  Tablet,  Oral) Active. Omeprazole (20MG  Capsule DR, Oral) Active. Multi-Vitamin (Oral) Active. Aspirin (81MG  Tablet, Oral) Active. Medications Reconciled  Social History Sallyanne Kuster, [place]; 08/29/2019 3:51 PM) Alcohol use Occasional alcohol use. Caffeine use Carbonated beverages. No drug use Tobacco use Former smoker.  Family History Sallyanne Kuster, [place]; 08/29/2019 3:51 PM) Family history unknown First Degree Relatives  Other Problems Sallyanne Kuster, [place]; 08/29/2019 3:51 PM) Arthritis Back Pain Inguinal Hernia     Review of Systems Sallyanne Kuster CMA; 08/29/2019 3:51 PM) General Not Present- Appetite Loss, Chills, Fatigue, Fever, Night Sweats, Weight Gain and Weight Loss. Skin Not Present- Change in Wart/Mole, Dryness, Hives, Jaundice, New Lesions, Non-Healing Wounds, Rash and Ulcer. HEENT Present- Wears glasses/contact lenses. Not Present- Earache, Hearing Loss, Hoarseness, Nose Bleed, Oral Ulcers, Ringing in the Ears, Seasonal Allergies, Sinus Pain, Sore Throat, Visual Disturbances and Yellow Eyes. Respiratory Not Present- Bloody sputum, Chronic Cough, Difficulty Breathing, Snoring and Wheezing. Breast Not Present- Breast Mass, Breast Pain, Nipple Discharge and Skin Changes. Cardiovascular Not Present- Chest Pain, Difficulty Breathing Lying Down, Leg Cramps, Palpitations, Rapid Heart Rate, Shortness of Breath and Swelling of Extremities. Gastrointestinal Present- Abdominal Pain. Not Present- Bloating, Bloody Stool, Change in Bowel Habits, Chronic diarrhea, Constipation, Difficulty Swallowing, Excessive gas, Gets full quickly at meals, Hemorrhoids, Indigestion, Nausea, Rectal Pain and Vomiting. Male Genitourinary Not Present- Blood in Urine, Change in Urinary Stream, Frequency, Impotence, Nocturia, Painful Urination, Urgency and Urine Leakage. Musculoskeletal Present- Back Pain, Joint Pain and Joint Stiffness. Not Present- Muscle Pain, Muscle Weakness and Swelling of  Extremities. Neurological Not Present- Decreased Memory, Fainting, Headaches, Numbness, Seizures, Tingling, Tremor, Trouble walking and Weakness. Psychiatric Not Present- Anxiety, Bipolar, Change in Sleep Pattern, Depression, Fearful  and Frequent crying. Endocrine Not Present- Cold Intolerance, Excessive Hunger, Hair Changes, Heat Intolerance, Hot flashes and New Diabetes. Hematology Not Present- Blood Thinners, Easy Bruising, Excessive bleeding, Gland problems, HIV and Persistent Infections.  Vitals Sallyanne Kuster CMA; 08/29/2019 3:54 PM) 08/29/2019 3:53 PM Weight: 177 lb Height: 69in Body Surface Area: 1.96 m Body Mass Index: 26.14 kg/m  Temp.: 33F  Pulse: 64 (Regular)  P.OX: 97% (Room air) BP: 119/78 (Sitting, Left Arm, Standard)    BP 117/81   Pulse 67   Temp 98.5 F (36.9 C) (Oral)   Resp 16   Ht 5\' 9"  (1.753 m)   Wt 81.5 kg   SpO2 100%   BMI 26.54 kg/m  02/06/2020     Physical Exam Adin Hector MD; 08/29/2019 4:12 PM)  General Mental Status-Alert. General Appearance-Not in acute distress, Not Sickly. Orientation-Oriented X3. Hydration-Well hydrated. Voice-Normal.  Integumentary Global Assessment Upon inspection and palpation of skin surfaces of the - Axillae: non-tender, no inflammation or ulceration, no drainage. and Distribution of scalp and body hair is normal. General Characteristics Temperature - normal warmth is noted.  Head and Neck Head-normocephalic, atraumatic with no lesions or palpable masses. Face Global Assessment - atraumatic, no absence of expression. Neck Global Assessment - no abnormal movements, no bruit auscultated on the right, no bruit auscultated on the left, no decreased range of motion, non-tender. Trachea-midline. Thyroid Gland Characteristics - non-tender.  Eye Eyeball - Left-Extraocular movements intact, No Nystagmus - Left. Eyeball - Right-Extraocular movements intact, No Nystagmus -  Right. Cornea - Left-No Hazy - Left. Cornea - Right-No Hazy - Right. Sclera/Conjunctiva - Left-No scleral icterus, No Discharge - Left. Sclera/Conjunctiva - Right-No scleral icterus, No Discharge - Right. Pupil - Left-Direct reaction to light normal. Pupil - Right-Direct reaction to light normal.  ENMT Ears Pinna - Left - no drainage observed, no generalized tenderness observed. Pinna - Right - no drainage observed, no generalized tenderness observed. Nose and Sinuses External Inspection of the Nose - no destructive lesion observed. Inspection of the nares - Left - quiet respiration. Inspection of the nares - Right - quiet respiration. Mouth and Throat Lips - Upper Lip - no fissures observed, no pallor noted. Lower Lip - no fissures observed, no pallor noted. Nasopharynx - no discharge present. Oral Cavity/Oropharynx - Tongue - no dryness observed. Oral Mucosa - no cyanosis observed. Hypopharynx - no evidence of airway distress observed.  Chest and Lung Exam Inspection Movements - Normal and Symmetrical. Accessory muscles - No use of accessory muscles in breathing. Palpation Palpation of the chest reveals - Non-tender. Auscultation Breath sounds - Normal and Clear.  Cardiovascular Auscultation Rhythm - Regular. Murmurs & Other Heart Sounds - Auscultation of the heart reveals - No Murmurs and No Systolic Clicks.  Abdomen Inspection Inspection of the abdomen reveals - No Visible peristalsis and No Abnormal pulsations. Umbilicus - No Bleeding, No Urine drainage. Palpation/Percussion Palpation and Percussion of the abdomen reveal - Soft, Non Tender, No Rebound tenderness, No Rigidity (guarding) and No Cutaneous hyperesthesia. Note: Abdomen soft. Nontender. Not distended. No umbilical or incisional hernias. No guarding.  Male Genitourinary Sexual Maturity Tanner 5 - Adult hair pattern and Adult penile size and shape. Note: Obvious left groin bulging consistent  with hernia. Right inguinal hernia on Valsalva as well otherwise normal external genitalia circumcised.  Peripheral Vascular Upper Extremity Inspection - Left - No Cyanotic nailbeds - Left, Not Ischemic. Inspection - Right - No Cyanotic nailbeds - Right, Not Ischemic.  Neurologic Neurologic evaluation  reveals -normal attention span and ability to concentrate, able to name objects and repeat phrases. Appropriate fund of knowledge , normal sensation and normal coordination. Mental Status Affect - not angry, not paranoid. Cranial Nerves-Normal Bilaterally. Gait-Normal.  Neuropsychiatric Mental status exam performed with findings of-able to articulate well with normal speech/language, rate, volume and coherence, thought content normal with ability to perform basic computations and apply abstract reasoning and no evidence of hallucinations, delusions, obsessions or homicidal/suicidal ideation.  Musculoskeletal Global Assessment Spine, Ribs and Pelvis - no instability, subluxation or laxity. Right Upper Extremity - no instability, subluxation or laxity.  Lymphatic Head & Neck  General Head & Neck Lymphatics: Bilateral - Description - No Localized lymphadenopathy. Axillary  General Axillary Region: Bilateral - Description - No Localized lymphadenopathy. Femoral & Inguinal  Generalized Femoral & Inguinal Lymphatics: Left - Description - No Localized lymphadenopathy. Right - Description - No Localized lymphadenopathy.    Assessment & Plan  BILATERAL INGUINAL HERNIA WITHOUT OBSTRUCTION OR GANGRENE, RECURRENCE NOT SPECIFIED (K40.20) Impression: Obvious left greater than right inguinal hernias with increasing pain and symptoms  I think he would benefit from surgical repair. Good candidate for laparoscopic approach. 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 If it becomes unbearable, he may do it sooner. I did  caution him we'll need at least 2 if not 3 weeks before coming back to light duty in 6 weeks for unrestricted. He feels optimistic that aching do light duty stuff in a few weeks.  Current Plans You are being scheduled for surgery- Our schedulers will call you.  You should hear from our office's scheduling department within 5 working days about the location, date, and time of surgery. We try to make accommodations for patient's preferences in scheduling surgery, but sometimes the OR schedule or the surgeon's schedule prevents [place] from making those accommodations.  If you have not heard from our office [phone]) in 5 working days, call the office and ask for your surgeon's nurse.  If you have other questions about your diagnosis, plan, or surgery, call the office and ask for your surgeon's nurse.  Written instructions provided The anatomy & physiology of the abdominal wall and pelvic floor was discussed. The pathophysiology of hernias in the inguinal and pelvic region was discussed. Natural history risks such as progressive enlargement, pain, incarceration, and strangulation was discussed. Contributors to complications such as smoking, obesity, diabetes, prior surgery, etc were discussed.  I feel the risks of no intervention will lead to serious problems that outweigh the operative risks; therefore, I recommended surgery to reduce and repair the hernia. I explained laparoscopic techniques with possible need for an open approach. I noted usual use of mesh to patch and/or buttress hernia repair  Risks such as bleeding, infection, abscess, need for further treatment, heart attack, death, and other risks were discussed. I noted a good likelihood this will help address the problem. Goals of post-operative recovery were discussed as well. Possibility that this will not correct all symptoms was explained. I stressed the importance of low-impact activity, aggressive pain control,  avoiding constipation, & not pushing through pain to minimize risk of post-operative chronic pain or injury. Possibility of reherniation was discussed. We will work to minimize complications.  An educational handout further explaining the pathology & treatment options was given as well. Questions were answered. The patient expresses understanding & wishes to proceed with surgery.  Adin Hector, MD, FACS, MASCRS  Gastrointestinal and Minimally Invasive Surgery    [address] [place], [place] [address] [phone] Main / Paging [phone] Fax

## 2020-02-06 NOTE — Op Note (Signed)
02/06/2020  12:52 PM  PATIENT:  Kevin Hill  54 y.o. male  Patient Care Team: [facility], [facility] as PCP - General (Family Medicine) Rourk, Cristopher Estimable, MD as Consulting Physician (Gastroenterology) Michael Boston, MD as Consulting Physician (General Surgery) Sharilyn Sites, MD as Consulting Physician (Family Medicine)  PRE-OPERATIVE DIAGNOSIS:  BILATERAL INGUINAL HERNIAS  POST-OPERATIVE DIAGNOSIS:  BILATERAL INGUINAL HERNIAS  PROCEDURE:  LAPAROSCOPIC BILATERAL INGUINAL HERNIAS REPAIRS WITH MESH TAP BLOCK - BILATERAL  SURGEON:  Adin Hector, MD  ASSISTANT: None  ANESTHESIA:     Regional ilioinguinal and genitofemoral and spermatic cord nerve blocks  General  Nerve block provided with liposomal bupivacaine (Experel) mixed with 0.25% bupivacaine as a Bilateral TAP block x 71mL each side at the level of the transverse abdominis & preperitoneal spaces along the flank at the anterior axillary line, from subcostal ridge to iliac crest under laparoscopic guidance    EBL:  Total I/O In: 700 [I.V.:700] Out: - .  See anesthesia record  Delay start of Pharmacological VTE agent (>24hrs) due to surgical blood loss or risk of bleeding:  no  DRAINS: NONE  SPECIMEN:   NONE  DISPOSITION OF SPECIMEN:  N/A  COUNTS:  YES  PLAN OF CARE: Discharge to home after PACU  PATIENT DISPOSITION:  PACU - hemodynamically stable.  INDICATION: Pleasant active gentleman with obvious left inguinal hernia and discomfort.  Evidence of right inguinal hernia as well.  I recommended laparoscopic exploration and repair of hernias found  The anatomy & physiology of the abdominal wall and pelvic floor was discussed.  The pathophysiology of hernias in the inguinal and pelvic region was discussed.  Natural history risks such as progressive enlargement, pain, incarceration & strangulation was discussed.   Contributors to complications such as smoking, obesity, diabetes, prior surgery, etc were  discussed.    I feel the risks of no intervention will lead to serious problems that outweigh the operative risks; therefore, I recommended surgery to reduce and repair the hernia.  I explained laparoscopic techniques with possible need for an open approach.  I noted usual use of mesh to patch and/or buttress hernia repair  Risks such as bleeding, infection, abscess, need for further treatment, heart attack, death, and other risks were discussed.  I noted a good likelihood this will help address the problem.   Goals of post-operative recovery were discussed as well.  Possibility that this will not correct all symptoms was explained.  I stressed the importance of low-impact activity, aggressive pain control, avoiding constipation, & not pushing through pain to minimize risk of post-operative chronic pain or injury. Possibility of reherniation was discussed.  We will work to minimize complications.     An educational handout further explaining the pathology & treatment options was given as well.  Questions were answered.  The patient expresses understanding & wishes to proceed with surgery.  OR FINDINGS: Moderate strays direct space left inguinal hernia.  Mild laxity but no definite indirect inguinal hernia.  No femoral or obturator hernias.  On the right side, indirect inguinal hernia.  Laxity at direct space but no true hernia.  No femoral nor obturator hernia  DESCRIPTION:  The patient was identified & brought into the operating room. The patient was positioned supine with arms tucked. SCDs were active during the entire case. The patient underwent general anesthesia without any difficulty.  The abdomen was prepped and draped in a sterile fashion. The patient's bladder was emptied.  A Surgical Timeout confirmed our plan.  I made a transverse incision through the inferior umbilical fold.  I made a small transverse nick through the anterior rectus fascia contralateral to the inguinal hernia side and  placed a 0-vicryl stitch through the fascia.  I placed a Hasson trocar into the preperitoneal plane.  Entry was clean.  We induced carbon dioxide insufflation. Camera inspection revealed no injury.  I used a 58mm angled scope to bluntly free the peritoneum off the infraumbilical anterior abdominal wall.  I created enough of a preperitoneal pocket to place 56mm ports into the right & left mid-abdomen into this preperitoneal cavity.  I focused attention on the LEFT pelvis since that was the dominant hernia side.   I used blunt & focused sharp dissection to free the peritoneum off the flank and down to the pubic rim.  I freed the anteriolateral bladder wall off the anteriolateral pelvic wall, sparing midline attachments.   I located a swath of peritoneum going into a hernia fascial defect at the  direct space consistent with  a direct space inguinal hernia..  I gradually freed the peritoneal hernia sac off safely and reduced it into the preperitoneal space.  I freed the peritoneum off the spermatic vessels & vas deferens.  I freed peritoneum off the retroperitoneum along the psoas muscle.  Spermatic cord lipomas were dissected away & removed.  I checked & assured hemostasis.     I turned attention on the opposite  RIGHT pelvis.  I did dissection in a similar, mirror-image fashion. The patient had an indirect inguinal hernia.Marland Kitchen   Spermatic cord lipoma was dissected away & removed.    I checked & assured hemostasis.     I chose 15x15 cm sheets of ultra-lightweight polypropylene mesh (Ultrapro), one for each side.  I cut a single sigmoid-shaped slit ~6cm from a corner of each mesh.  I placed the meshes into the preperitoneal space & laid them as overlapping diamonds such that at the inferior points, a 6x6 cm corner flap rested in the true anterolateral pelvis, covering the obturator & femoral foramina.   I allowed the bladder to return to the pubis, this helping tuck the corners of the mesh in the anteriolateral  pelvis.  The medial corners overlapped each other across midline cephalad to the pubic rim.   Given the numerous hernias of moderate size, I placed a third 15x15cm mesh in the center as a vertical diamond.  The lateral wings of the mesh overlap across the direct spaces and internal rings where the dominant hernias were.  This provided good coverage and reinforcement of the hernia repairs.  Because of the central mesh placement with good overlap, I did not place any tacks.   I held the hernia sacs cephalad & evacuated carbon dioxide.  I closed the fascia with absorbable suture.  I closed the skin using 4-0 monocryl stitch.  Sterile dressings were applied.   The patient was extubated & arrived in the PACU in stable condition..  I had discussed postoperative care with the patient in the holding area.  Instructions are written in the chart.  I discussed operative findings, updated the patient's status, discussed probable steps to recovery, and gave postoperative recommendations to the patient's spouse, Jusitn Fei.  Recommendations were made.  Questions were answered.  She expressed understanding & appreciation.   Adin Hector, M.D., F.A.C.S. Gastrointestinal and Minimally Invasive Surgery [facility] [address], [place], [place] [address] [phone] Main / Paging  02/06/2020 12:52 PM

## 2020-02-06 NOTE — Discharge Instructions (Signed)
[facility] REPAIR: POST OP INSTRUCTIONS  ######################################################################  EAT Gradually transition to a high fiber diet with a fiber supplement over the next few weeks after discharge.  Start with a pureed / full liquid diet (see below)  WALK Walk an hour a day.  Control your pain to do that.    CONTROL PAIN Control pain so that you can walk, sleep, tolerate sneezing/coughing, and go up/down stairs.  HAVE A BOWEL MOVEMENT DAILY Keep your bowels regular to avoid problems.  OK to try a laxative to override constipation.  OK to use an antidairrheal to slow down diarrhea.  Call if not better after 2 tries  CALL IF YOU HAVE PROBLEMS/CONCERNS Call if you are still struggling despite following these instructions. Call if you have concerns not answered by these instructions  ######################################################################    1. DIET: Follow a light bland diet & liquids the first 24 hours after arrival home, such as soup, liquids, starches, etc.  Be sure to drink plenty of fluids.  Quickly advance to a usual solid diet within a few days.  Avoid fast food or heavy meals as your are more likely to get nauseated or have irregular bowels.  A low-fat, high-fiber diet for the rest of your life is ideal.   2. Take your usually prescribed home medications unless otherwise directed.  3. PAIN CONTROL: a. Pain is best controlled by a usual combination of three different methods TOGETHER: i. Ice/Heat ii. Over the counter pain medication iii. Prescription pain medication b. Most patients will experience some swelling and bruising around the hernia(s) such as the bellybutton, groins, or old incisions.  Ice packs or heating pads (30-60 minutes up to 6 times a day) will help. Use ice for the first few days to help decrease swelling and bruising, then switch to heat to help relax tight/sore spots and speed recovery.  Some people prefer to use ice  alone, heat alone, alternating between ice & heat.  Experiment to what works for you.  Swelling and bruising can take several weeks to resolve.   c. It is helpful to take an over-the-counter pain medication regularly for the first few weeks.  Choose one of the following that works best for you: i. Naproxen (Aleve, etc)  Two 244m tabs twice a day ii. Ibuprofen (Advil, etc) Three 2036mtabs four times a day (every meal & bedtime) iii. Acetaminophen (Tylenol, etc) 325-65016mour times a day (every meal & bedtime) d. A  prescription for pain medication should be given to you upon discharge.  Take your pain medication as prescribed.  i. If you are having problems/concerns with the prescription medicine (does not control pain, nausea, vomiting, rash, itching, etc), please call us [phone] see if we need to switch you to a different pain medicine that will work better for you and/or control your side effect better. ii. If you need a refill on your pain medication, please contact your pharmacy.  They will contact our office to request authorization. Prescriptions will not be filled after 5 pm or on week-ends.  4. Avoid getting constipated.  Between the surgery and the pain medications, it is common to experience some constipation.  Increasing fluid intake and taking a fiber supplement (such as Metamucil, Citrucel, FiberCon, MiraLax, etc) 1-2 times a day regularly will usually help prevent this problem from occurring.  A mild laxative (prune juice, Milk of Magnesia, MiraLax, etc) should be taken according to package directions if there are no bowel movements after 48  hours.    5. Wash / shower every day.  You may shower over the dressings as they are waterproof.    6. Remove your waterproof bandages, skin tapes, and other bandages 5 days after surgery. You may replace a dressing/Band-Aid to cover the incision for comfort if you wish. You may leave the incisions open to air.  You may replace a  dressing/Band-Aid to cover an incision for comfort if you wish.  Continue to shower over incision(s) after the dressing is off.  7. ACTIVITIES as tolerated:   a. You may resume regular (light) daily activities beginning the next day--such as daily self-care, walking, climbing stairs--gradually increasing activities as tolerated.  Control your pain so that you can walk an hour a day.  If you can walk 30 minutes without difficulty, it is safe to try more intense activity such as jogging, treadmill, bicycling, low-impact aerobics, swimming, etc. b. Save the most intensive and strenuous activity for last such as sit-ups, heavy lifting, contact sports, etc  Refrain from any heavy lifting or straining until you are off narcotics for pain control.   c. DO NOT PUSH THROUGH PAIN.  Let pain be your guide: If it hurts to do something, don't do it.  Pain is your body warning you to avoid that activity for another week until the pain goes down. d. You may drive when you are no longer taking prescription pain medication, you can comfortably wear a seatbelt, and you can safely maneuver your car and apply brakes. e. Dennis Bast may have sexual intercourse when it is comfortable.   8. FOLLOW UP in our office a. Please call [facility] at [phone] to set up an appointment to see your surgeon in the office for a follow-up appointment approximately 2-3 weeks after your surgery. b. Make sure that you call for this appointment the day you arrive home to insure a convenient appointment time.  9.  If you have disability of FMLA / Family leave forms, please bring the forms to the office for processing.  (do not give to your surgeon).  WHEN TO CALL us [phone]: 1. Poor pain control 2. Reactions / problems with new medications (rash/itching, nausea, etc)  3. Fever over 101.5 F (38.5 C) 4. Inability to urinate 5. Nausea and/or vomiting 6. Worsening swelling or bruising 7. Continued bleeding from incision. 8. Increased pain,  redness, or drainage from the incision   The clinic staff is available to answer your questions during regular business hours (8:30am-5pm).  Please don't hesitate to call and ask to speak to one of our nurses for clinical concerns.   If you have a medical emergency, go to the nearest emergency room or call 911.  A surgeon from [facility] is always on call at the hospitals in [facility], [place], [place], [place], [place]  [address] ?  [address] [address], [place], [place]   [address] MAIN: [phone] ? TOLL FREE: [phone] ? FAX: [phone] [email or web address]   Post Anesthesia Home Care Instructions  Activity: Get plenty of rest for the remainder of the day. A responsible individual must stay with you for 24 hours following the procedure.  For the next 24 hours, DO NOT: -Drive a car -Paediatric nurse -Drink alcoholic beverages -Take any medication unless instructed by your physician -Make any legal decisions or sign important papers.  Meals: Start with liquid foods such as gelatin or soup. Progress to regular foods as tolerated. Avoid greasy,  spicy, heavy foods. If nausea and/or vomiting occur, drink only clear liquids until the nausea and/or vomiting subsides. Call your physician if vomiting continues.  Special Instructions/Symptoms: Your throat may feel dry or sore from the anesthesia or the breathing tube placed in your throat during surgery. If this causes discomfort, gargle with warm salt water. The discomfort should disappear within 24 hours.  If you had a scopolamine patch placed behind your ear for the management of post- operative nausea and/or vomiting:  1. The medication in the patch is effective for 72 hours, after which it should be removed.  Wrap patch in a tissue and discard in the trash. Wash hands thoroughly with soap and water. 2. You may remove the patch earlier than 72 hours if you experience  unpleasant side effects which may include dry mouth, dizziness or visual disturbances. 3. Avoid touching the patch. Wash your hands with soap and water after contact with the patch.    Information for Discharge Teaching: EXPAREL (bupivacaine liposome injectable suspension)   Your surgeon or anesthesiologist gave you EXPAREL(bupivacaine) to help control your pain after surgery.   EXPAREL is a local anesthetic that provides pain relief by numbing the tissue around the surgical site.  EXPAREL is designed to release pain medication over time and can control pain for up to 72 hours.  Depending on how you respond to EXPAREL, you may require less pain medication during your recovery.  Possible side effects:  Temporary loss of sensation or ability to move in the area where bupivacaine was injected.  Nausea, vomiting, constipation  Rarely, numbness and tingling in your mouth or lips, lightheadedness, or anxiety may occur.  Call your doctor right away if you think you may be experiencing any of these sensations, or if you have other questions regarding possible side effects.  Follow all other discharge instructions given to you by your surgeon or nurse. Eat a healthy diet and drink plenty of water or other fluids.  If you return to the hospital for any reason within 96 hours following the administration of EXPAREL, it is important for health care providers to know that you have received this anesthetic. A teal colored band has been placed on your arm with the date, time and amount of EXPAREL you have received in order to alert and inform your health care providers. Please leave this armband in place for the full 96 hours following administration, and then you may remove the band.  May take Tyelnol at 4 PM.

## 2020-02-06 NOTE — Transfer of Care (Signed)
Immediate Anesthesia Transfer of Care Note  Patient: Kevin Hill  Procedure(s) Performed: Procedure(s) (LRB): LAPAROSCOPIC BILATERAL INGUINAL HERNIAS REPAIRS WITH MESH (Bilateral)  Patient Location: PACU  Anesthesia Type: General  Level of Consciousness: awake, oriented, sedated and patient cooperative  Airway & Oxygen Therapy: Patient Spontanous Breathing and Patient connected to face mask oxygen  Post-op Assessment: Report given to PACU RN and Post -op Vital signs reviewed and stable  Post vital signs: Reviewed and stable  Complications: No apparent anesthesia complications Last Vitals:  Vitals Value Taken Time  BP 152/88 02/06/20 1301  Temp 36.3 C 02/06/20 1300  Pulse 80 02/06/20 1308  Resp 14 02/06/20 1308  SpO2 100 % 02/06/20 1308  Vitals shown include unvalidated device data.  Last Pain:  Vitals:   02/06/20 1300  TempSrc:   PainSc: 7       Patients Stated Pain Goal: 5 (02/06/20 1300)

## 2020-02-06 NOTE — Anesthesia Procedure Notes (Signed)
Procedure Name: Intubation Date/Time: 02/06/2020 11:08 AM Performed by: Suan Halter, CRNA Pre-anesthesia Checklist: Patient identified, Emergency Drugs available, Suction available and Patient being monitored Patient Re-evaluated:Patient Re-evaluated prior to induction Oxygen Delivery Method: Circle system utilized Preoxygenation: Pre-oxygenation with 100% oxygen Induction Type: IV induction Ventilation: Mask ventilation without difficulty Laryngoscope Size: Mac and 4 Grade View: Grade I Tube type: Oral Tube size: 7.5 mm Number of attempts: 1 Airway Equipment and Method: Stylet and Oral airway Placement Confirmation: ETT inserted through vocal cords under direct vision,  positive ETCO2 and breath sounds checked- equal and bilateral Secured at: 23 cm Tube secured with: Tape Dental Injury: Teeth and Oropharynx as per pre-operative assessment

## 2020-02-06 NOTE — Anesthesia Postprocedure Evaluation (Signed)
Anesthesia Post Note  Patient: Kevin Hill  Procedure(s) Performed: LAPAROSCOPIC BILATERAL INGUINAL HERNIAS REPAIRS WITH MESH (Bilateral Abdomen)     Patient location during evaluation: PACU Anesthesia Type: General Level of consciousness: awake and alert, oriented and patient cooperative Pain management: pain level controlled Vital Signs Assessment: post-procedure vital signs reviewed and stable Respiratory status: spontaneous breathing, nonlabored ventilation and respiratory function stable Cardiovascular status: blood pressure returned to baseline and stable Postop Assessment: no apparent nausea or vomiting Anesthetic complications: no    Last Vitals:  Vitals:   02/06/20 1315 02/06/20 1330  BP: (!) 142/87 (!) 144/88  Pulse: 78 69  Resp: 16 14  Temp:    SpO2: 100% 100%    Last Pain:  Vitals:   02/06/20 1330  TempSrc:   PainSc: [place]

## 2020-02-06 NOTE — Interval H&P Note (Signed)
History and Physical Interval Note:  02/06/2020 10:58 AM  Kevin Hill  has presented today for surgery, with the diagnosis of BILATERAL INGUINAL HERNIAS.  The various methods of treatment have been discussed with the patient and family. After consideration of risks, benefits and other options for treatment, the patient has consented to  Procedure(s): BILATERAL INGUINAL HERNIAS WITH MESH (Bilateral) as a surgical intervention.  The patient's history has been reviewed, patient examined, no change in status, stable for surgery.  I have reviewed the patient's chart and labs.  Questions were answered to the patient's satisfaction.    I have re-reviewed the the patient's records, history, medications, and allergies.  I have re-examined the patient.  I again discussed intraoperative plans and goals of post-operative recovery.  The patient agrees to proceed.  Kevin Hill  Aug 04, 1966 [id number]  Patient Care Team: Empire, Shidler as PCP - General (Family Medicine) Gala Romney, Cristopher Estimable, MD as Consulting Physician (Gastroenterology) Michael Boston, MD as Consulting Physician (General Surgery) Sharilyn Sites, MD as Consulting Physician (Family Medicine)  Patient Active Problem List   Diagnosis Date Noted   Bilateral inguinal hernia (BIH) 02/06/2020   History of colonic polyps    Diverticulosis of colon without hemorrhage    Encounter for screening colonoscopy 06/18/2015    Past Medical History:  Diagnosis Date   Acid reflux    Arthritis    Bilateral inguinal hernia    Depression    Headache    HX OF   Herniated disc, cervical    High cholesterol     Past Surgical History:  Procedure Laterality Date   cervical plates  QA348G   COLONOSCOPY  about 10 years ago   in [place] N/A 07/10/2015   Dr. 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   KNEE SURGERY Bilateral    NECK INJECTIONS     NECK SURGERY     C 4, 5 AND 6   ORTHOPEDIC SURGERY     POLYPECTOMY N/A 07/10/2015   Procedure: POLYPECTOMY;  Surgeon: Daneil Dolin, MD;  Location: [facility]S;  Service: Endoscopy;  Laterality: N/A;  cecal, Ascending Colon, Splenic Flexure   thumb surgery Right 2017   2 SCREWS    Social History   Socioeconomic History   Marital status: Married    Spouse name: Not on file   Number of children: Not on file   Years of education: Not on file   Highest education level: Not on file  Occupational History   Not on file  Tobacco Use   Smoking status: Former Smoker    Packs/day: 0.50    Years: 30.00    Pack years: 15.00    Types: Cigarettes    Quit date: 09/22/2015    Years since quitting: 4.3   Smokeless tobacco: Former Systems developer    Types: [place] date: 06/21/2018  Substance and Sexual Activity   Alcohol use: Yes    Alcohol/week: 0.0 standard drinks    Comment: OCC   Drug use: No   Sexual activity: Yes    Birth control/protection: None  Other Topics Concern   Not on file  Social History Narrative   Not on file  Social Determinants of Health   Financial Resource Strain:    Difficulty of Paying Living Expenses: Not on file  Food Insecurity:    Worried About Charity fundraiser in the Last Year: Not on file   [employer] of Food in the Last Year: Not on file  Transportation Needs:    Lack of Transportation (Medical): Not on file   Lack of Transportation (Non-Medical): Not on file  Physical Activity:    Days of Exercise per Week: Not on file   Minutes of Exercise per Session: Not on file  Stress:    Feeling of Stress : Not on file  Social Connections:    Frequency of Communication with Friends and Family: Not on file   Frequency of Social Gatherings with Friends and Family: Not on  file   Attends Religious Services: Not on file   Active Member of [place] or Organizations: Not on file   Attends Archivist Meetings: Not on file   Marital Status: Not on file  Intimate Partner Violence:    Fear of Current or Ex-Partner: Not on file   Emotionally Abused: Not on file   Physically Abused: Not on file   Sexually Abused: Not on file    Family History  Problem Relation Age of Onset   Colon cancer Neg Hx    Colon polyps Neg Hx     Medications Prior to Admission  Medication Sig Dispense Refill Last Dose   aspirin 81 MG tablet Take 81 mg by mouth daily.   Past Week at Unknown time   atorvastatin (LIPITOR) 20 MG tablet Take 20 mg by mouth daily.   02/06/2020 at 0730   ibuprofen (ADVIL) 200 MG tablet Take 200 mg by mouth every 6 (six) hours as needed. TAKES 3 OF 200 MG   Past Week at Unknown time   Multiple Vitamin (MULTIVITAMIN) tablet Take 1 tablet by mouth daily.   02/06/2020 at 0730   omeprazole (PRILOSEC) 20 MG capsule Take 20 mg by mouth daily.  2 02/06/2020 at 0730    Current Facility-Administered Medications  Medication Dose Route Frequency Provider Last Rate Last Admin   acetaminophen (TYLENOL) tablet 1,000 mg  1,000 mg Oral Once Finucane, Elizabeth M, DO       bupivacaine liposome (EXPAREL) 1.3 % injection    PRN Michael Boston, MD   20 mL at 02/06/20 1054   bupivacaine-epinephrine (MARCAINE W/ EPI) 0.5% -1:200000 injection    PRN Michael Boston, MD   40 mL at 02/06/20 1054   ceFAZolin (ANCEF) IVPB 2g/100 mL premix  2 g Intravenous On Call to OR Michael Boston, MD       Chlorhexidine Gluconate Cloth 2 % PADS 6 each  6 each Topical Once Michael Boston, MD       And   Chlorhexidine Gluconate Cloth 2 % PADS 6 each  6 each Topical Once Michael Boston, MD       lactated ringers infusion   Intravenous Continuous Audry Pili, MD 50 mL/hr at 02/06/20 1007 New Bag at 02/06/20 1007   sodium chloride (PF) 0.9 % injection    PRN Michael Boston, MD   20 mL at 02/06/20  1054     No Known Allergies  BP 117/81   Pulse 67   Temp 98.5 F (36.9 C) (Oral)   Resp 16   Ht 5\' 9"  (1.753 m)   Wt 81.5 kg   SpO2 100%   BMI 26.54 kg/m   Labs:  No results found for this or any previous visit (from the past 48 hour(s)).  Imaging / Studies: No results found.   Adin Hector, M.D., F.A.C.S. Gastrointestinal and Minimally Invasive Surgery [facility] [address], [place], [place] [address] [phone] Main / Paging  02/06/2020 10:58 AM    Adin Hector

## 2020-02-06 NOTE — Interval H&P Note (Signed)
History and Physical Interval Note:  02/06/2020 10:58 AM  Kevin Hill  has presented today for surgery, with the diagnosis of BILATERAL INGUINAL HERNIAS.  The various methods of treatment have been discussed with the patient and family. After consideration of risks, benefits and other options for treatment, the patient has consented to  Procedure(s): BILATERAL INGUINAL HERNIAS WITH MESH (Bilateral) as a surgical intervention.  The patient's history has been reviewed, patient examined, no change in status, stable for surgery.  I have reviewed the patient's chart and labs.  Questions were answered to the patient's satisfaction.     Adin Hector

## 2020-02-13 MED FILL — OMEPRAZOLE 20 MG CAP: 20 | 90 days supply | Qty: 90 | Fill #0

## 2020-02-20 MED FILL — traMADol HCL 50 MG TABS: 50 | 7 days supply | Qty: 30 | Fill #0

## 2020-03-17 MED FILL — ATORVASTATIN 20 MG TABLET: 20 | 90 days supply | Qty: 90 | Fill #2

## 2020-06-13 ENCOUNTER — Telehealth: Payer: No Typology Code available for payment source | Admitting: Emergency Medicine

## 2020-06-13 DIAGNOSIS — J069 Acute upper respiratory infection, unspecified: Secondary | ICD-10-CM | POA: Diagnosis not present

## 2020-06-13 MED ORDER — AMOXICILLIN-POT CLAVULANATE 875-125 MG PO TABS: 1.0000 | ORAL_TABLET | Freq: Two times a day (BID) | ORAL | 0 refills | Status: DC

## 2020-06-13 MED ORDER — BENZONATATE 200 MG PO CAPS: 200.0000 mg | ORAL_CAPSULE | Freq: Two times a day (BID) | ORAL | 0 refills | Status: DC | PRN

## 2020-06-13 NOTE — Progress Notes (Signed)
Time spent: 10 min  We are sorry that you are not feeling well.  Here is how we plan to help!  Based on what you have shared with me it looks like you have sinusitis.  Sinusitis is inflammation and infection in the sinus cavities of the head.  Based on your presentation I believe you most likely have Acute Bacterial Sinusitis.  This is an infection caused by bacteria and is treated with antibiotics. I have prescribed Augmentin 875mg /125mg  one tablet twice daily with food, for 7 days. You may use an oral decongestant such as Mucinex D or if you have glaucoma or high blood pressure use plain Mucinex. Saline nasal spray help and can safely be used as often as needed for congestion.  Alternate ibuprofen and acetaminophen for aches and pains as needed. If you develop worsening sinus pain, fever or notice severe headache and vision changes, or if symptoms are not better after completion of antibiotic, please schedule an appointment with a health care provider.    Sinus infections are not as easily transmitted as other respiratory infection, however we still recommend that you avoid close contact with loved ones, especially the very young and elderly.  Remember to wash your hands thoroughly throughout the day as this is the number one way to prevent the spread of infection!  Home Care:  Only take medications as instructed by your medical team.  Complete the entire course of an antibiotic.  Do not take these medications with alcohol.  A steam or ultrasonic humidifier can help congestion.  You can place a towel over your head and breathe in the steam from hot water coming from a faucet.  Avoid close contacts especially the very young and the elderly.  Cover your mouth when you cough or sneeze.  Always remember to wash your hands.  Get Help Right Away If:  You develop worsening fever or sinus pain.  You develop a severe head ache or visual changes.  Your symptoms persist after you have completed  your treatment plan.  Make sure you  Understand these instructions.  Will watch your condition.  Will get help right away if you are not doing well or get worse.  Your e-visit answers were reviewed by a board certified advanced clinical practitioner to complete your personal care plan.  Depending on the condition, your plan could have included both over the counter or prescription medications.  If there is a problem please reply  once you have received a response from your provider.  Your safety is important to [place].  If you have drug allergies check your prescription carefully.    You can use MyChart to ask questions about today's visit, request a non-urgent call back, or ask for a work or school excuse for 24 hours related to this e-Visit. If it has been greater than 24 hours you will need to follow up with your provider, or enter a new e-Visit to address those concerns.  You will get an e-mail in the next two days asking about your experience.  I hope that your e-visit has been valuable and will speed your recovery. Thank you for using e-visits.

## 2020-06-16 MED FILL — ATORVASTATIN 20 MG TABLET: 20 | 90 days supply | Qty: 90 | Fill #3

## 2020-07-21 MED FILL — OMEPRAZOLE 20 MG CAP: 20 | 90 days supply | Qty: 90 | Fill #2

## 2020-09-15 MED FILL — ATORVASTATIN CALCIUM 20 MG: 20 | 90 days supply | Qty: 90 | Fill #0

## 2020-10-13 MED FILL — OMEPRAZOLE 20 MG CAP: 20 | 90 days supply | Qty: 90 | Fill #3

## 2020-12-15 MED FILL — ATORVASTATIN CALCIUM 20 MG: 20 | 90 days supply | Qty: 90 | Fill #1

## 2021-01-12 ENCOUNTER — Other Ambulatory Visit (HOSPITAL_COMMUNITY): Payer: Self-pay | Admitting: Physician Assistant

## 2021-01-12 MED FILL — OMEPRAZOLE 20 MG CAP: 20 | 90 days supply | Qty: 90 | Fill #0

## 2021-03-09 ENCOUNTER — Other Ambulatory Visit (HOSPITAL_BASED_OUTPATIENT_CLINIC_OR_DEPARTMENT_OTHER): Payer: Self-pay

## 2021-03-16 ENCOUNTER — Other Ambulatory Visit (HOSPITAL_COMMUNITY): Payer: Self-pay | Admitting: Physician Assistant

## 2021-03-16 MED FILL — ATORVASTATIN CALCIUM 20 MG: 20 | 90 days supply | Qty: 90 | Fill #0

## 2021-04-19 MED FILL — Omeprazole Cap Delayed Release 20 MG: ORAL | 90 days supply | Qty: 90 | Fill #0 | Status: AC

## 2021-04-20 ENCOUNTER — Other Ambulatory Visit (HOSPITAL_COMMUNITY): Payer: Self-pay

## 2021-04-22 ENCOUNTER — Other Ambulatory Visit (HOSPITAL_COMMUNITY): Payer: Self-pay

## 2021-04-29 ENCOUNTER — Other Ambulatory Visit (HOSPITAL_COMMUNITY): Payer: Self-pay

## 2021-04-29 MED ORDER — CYCLOBENZAPRINE HCL 10 MG PO TABS: 10.0000 mg | ORAL_TABLET | Freq: Three times a day (TID) | ORAL | 2 refills | Status: DC | PRN

## 2021-04-29 MED ORDER — PREDNISONE 10 MG PO TABS: ORAL_TABLET | ORAL | 0 refills | Status: DC

## 2021-06-14 MED FILL — Atorvastatin Calcium Tab 20 MG (Base Equivalent): ORAL | 90 days supply | Qty: 90 | Fill #0 | Status: AC

## 2021-06-15 ENCOUNTER — Other Ambulatory Visit (HOSPITAL_COMMUNITY): Payer: Self-pay

## 2021-07-05 MED FILL — Omeprazole Cap Delayed Release 20 MG: ORAL | 90 days supply | Qty: 90 | Fill #1 | Status: AC

## 2021-07-06 ENCOUNTER — Other Ambulatory Visit (HOSPITAL_COMMUNITY): Payer: Self-pay

## 2021-08-10 ENCOUNTER — Other Ambulatory Visit (HOSPITAL_COMMUNITY): Payer: Self-pay

## 2021-08-10 MED ORDER — TRAZODONE HCL 50 MG PO TABS
ORAL_TABLET | ORAL | 5 refills | Status: DC
  Filled 2021-08-10: qty 30, 30d supply, fill #0

## 2021-08-12 ENCOUNTER — Other Ambulatory Visit (HOSPITAL_COMMUNITY): Payer: Self-pay

## 2021-08-12 MED ORDER — TRAZODONE HCL 50 MG PO TABS
100.0000 mg | ORAL_TABLET | Freq: Every evening | ORAL | 5 refills | Status: DC
  Filled 2021-08-12 – 2021-08-21 (×2): qty 60, 30d supply, fill #0
  Filled 2021-09-24: qty 60, 30d supply, fill #1
  Filled 2021-10-28: qty 60, 30d supply, fill #2
  Filled 2021-12-01: qty 60, 30d supply, fill #3
  Filled 2021-12-31: qty 60, 30d supply, fill #4
  Filled 2022-01-24: qty 60, 30d supply, fill #5

## 2021-08-20 ENCOUNTER — Other Ambulatory Visit (HOSPITAL_COMMUNITY): Payer: Self-pay

## 2021-08-21 ENCOUNTER — Other Ambulatory Visit (HOSPITAL_COMMUNITY): Payer: Self-pay

## 2021-08-22 ENCOUNTER — Other Ambulatory Visit (HOSPITAL_COMMUNITY): Payer: Self-pay

## 2021-08-25 ENCOUNTER — Other Ambulatory Visit (HOSPITAL_COMMUNITY): Payer: Self-pay

## 2021-09-20 ENCOUNTER — Other Ambulatory Visit (HOSPITAL_COMMUNITY): Payer: Self-pay

## 2021-09-21 ENCOUNTER — Other Ambulatory Visit (HOSPITAL_COMMUNITY): Payer: Self-pay

## 2021-09-22 ENCOUNTER — Other Ambulatory Visit (HOSPITAL_COMMUNITY): Payer: Self-pay

## 2021-09-22 MED ORDER — ATORVASTATIN CALCIUM 20 MG PO TABS
ORAL_TABLET | ORAL | 3 refills | Status: DC
  Filled 2021-09-22: qty 90, 90d supply, fill #0
  Filled 2021-12-20: qty 90, 90d supply, fill #1
  Filled 2022-03-21: qty 90, 90d supply, fill #2
  Filled 2022-07-04: qty 90, 90d supply, fill #3

## 2021-09-24 ENCOUNTER — Other Ambulatory Visit (HOSPITAL_COMMUNITY): Payer: Self-pay

## 2021-10-04 MED FILL — Omeprazole Cap Delayed Release 20 MG: ORAL | 90 days supply | Qty: 90 | Fill #2 | Status: AC

## 2021-10-05 ENCOUNTER — Other Ambulatory Visit (HOSPITAL_COMMUNITY): Payer: Self-pay

## 2021-10-12 ENCOUNTER — Telehealth: Payer: No Typology Code available for payment source | Admitting: Physician Assistant

## 2021-10-12 DIAGNOSIS — J069 Acute upper respiratory infection, unspecified: Secondary | ICD-10-CM

## 2021-10-12 MED ORDER — IPRATROPIUM BROMIDE 0.03 % NA SOLN: 2.0000 | Freq: Two times a day (BID) | NASAL | 12 refills | Status: DC

## 2021-10-12 MED ORDER — BENZONATATE 100 MG PO CAPS: 100.0000 mg | ORAL_CAPSULE | Freq: Three times a day (TID) | ORAL | 0 refills | Status: DC | PRN

## 2021-10-12 NOTE — Progress Notes (Signed)

## 2021-10-28 ENCOUNTER — Other Ambulatory Visit (HOSPITAL_COMMUNITY): Payer: Self-pay

## 2021-12-01 ENCOUNTER — Other Ambulatory Visit (HOSPITAL_COMMUNITY): Payer: Self-pay

## 2021-12-20 ENCOUNTER — Other Ambulatory Visit (HOSPITAL_COMMUNITY): Payer: Self-pay

## 2021-12-21 ENCOUNTER — Other Ambulatory Visit (HOSPITAL_COMMUNITY): Payer: Self-pay

## 2022-01-01 ENCOUNTER — Other Ambulatory Visit (HOSPITAL_COMMUNITY): Payer: Self-pay

## 2022-01-03 ENCOUNTER — Other Ambulatory Visit (HOSPITAL_COMMUNITY): Payer: Self-pay

## 2022-01-04 ENCOUNTER — Other Ambulatory Visit (HOSPITAL_COMMUNITY): Payer: Self-pay

## 2022-01-04 MED ORDER — OMEPRAZOLE 20 MG PO CPDR
DELAYED_RELEASE_CAPSULE | ORAL | 1 refills | Status: DC
  Filled 2022-01-04: qty 90, 90d supply, fill #0
  Filled 2022-05-31: qty 90, 90d supply, fill #1

## 2022-01-11 ENCOUNTER — Other Ambulatory Visit (HOSPITAL_COMMUNITY): Payer: Self-pay

## 2022-01-25 ENCOUNTER — Other Ambulatory Visit (HOSPITAL_COMMUNITY): Payer: Self-pay

## 2022-02-23 ENCOUNTER — Other Ambulatory Visit: Payer: Self-pay | Admitting: Family Medicine

## 2022-02-23 ENCOUNTER — Other Ambulatory Visit (HOSPITAL_COMMUNITY): Payer: Self-pay | Admitting: Family Medicine

## 2022-02-23 ENCOUNTER — Other Ambulatory Visit (HOSPITAL_COMMUNITY): Payer: Self-pay

## 2022-02-23 DIAGNOSIS — M5136 Other intervertebral disc degeneration, lumbar region: Secondary | ICD-10-CM

## 2022-02-23 MED ORDER — CYCLOBENZAPRINE HCL 10 MG PO TABS
ORAL_TABLET | ORAL | 2 refills | Status: DC
  Filled 2022-02-23: qty 45, 15d supply, fill #0

## 2022-02-23 MED ORDER — PREDNISONE 10 MG PO TABS
ORAL_TABLET | ORAL | 0 refills | Status: DC
  Filled 2022-02-23: qty 45, 15d supply, fill #0

## 2022-02-23 MED ORDER — HYDROCODONE-ACETAMINOPHEN 5-325 MG PO TABS
1.0000 | ORAL_TABLET | Freq: Three times a day (TID) | ORAL | 0 refills | Status: DC | PRN
  Filled 2022-02-23: qty 60, 20d supply, fill #0

## 2022-03-03 ENCOUNTER — Other Ambulatory Visit (HOSPITAL_COMMUNITY): Payer: Self-pay

## 2022-03-04 ENCOUNTER — Other Ambulatory Visit (HOSPITAL_COMMUNITY): Payer: Self-pay

## 2022-03-04 MED ORDER — TRAZODONE HCL 50 MG PO TABS
100.0000 mg | ORAL_TABLET | Freq: Every evening | ORAL | 5 refills | Status: DC
  Filled 2022-03-04: qty 60, 30d supply, fill #0
  Filled 2022-04-04: qty 60, 30d supply, fill #1
  Filled 2022-05-02: qty 60, 30d supply, fill #2
  Filled 2022-06-06: qty 60, 30d supply, fill #3
  Filled 2022-07-04: qty 60, 30d supply, fill #4
  Filled 2022-08-08: qty 60, 30d supply, fill #5

## 2022-03-18 ENCOUNTER — Ambulatory Visit (HOSPITAL_COMMUNITY)
Admission: RE | Admit: 2022-03-18 | Discharge: 2022-03-18 | Disposition: A | Discharge status: Home / Self Care | Payer: No Typology Code available for payment source | Source: Ambulatory Visit | Attending: Family Medicine | Admitting: Family Medicine

## 2022-03-18 DIAGNOSIS — M5136 Other intervertebral disc degeneration, lumbar region: Secondary | ICD-10-CM | POA: Insufficient documentation

## 2022-03-21 ENCOUNTER — Other Ambulatory Visit (HOSPITAL_COMMUNITY): Payer: Self-pay

## 2022-03-22 ENCOUNTER — Other Ambulatory Visit (HOSPITAL_COMMUNITY): Payer: Self-pay

## 2022-04-05 ENCOUNTER — Other Ambulatory Visit (HOSPITAL_COMMUNITY): Payer: Self-pay

## 2022-05-03 ENCOUNTER — Other Ambulatory Visit (HOSPITAL_COMMUNITY): Payer: Self-pay

## 2022-05-31 ENCOUNTER — Other Ambulatory Visit (HOSPITAL_COMMUNITY): Payer: Self-pay

## 2022-06-07 ENCOUNTER — Other Ambulatory Visit (HOSPITAL_COMMUNITY): Payer: Self-pay

## 2022-07-03 IMAGING — MR MR LUMBAR SPINE W/O CM
5 series · 31 of 48 positions shown · non-contrast
Comparison: None.

CLINICAL DATA: DDD (degenerative disc disease), lumbar
([id number])

EXAM:
MRI LUMBAR SPINE WITHOUT CONTRAST
TECHNIQUE: Multiplanar, multisequence MR imaging of the lumbar spine was
performed. No intravenous contrast was administered.

[Series 5: T2 · sagittal · 4.0mm · 0.68mm/px · 6 of 15 slices shown (1 of 2)]
[im 1/15]
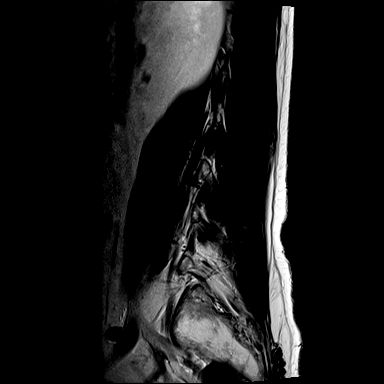
[im 3/15]
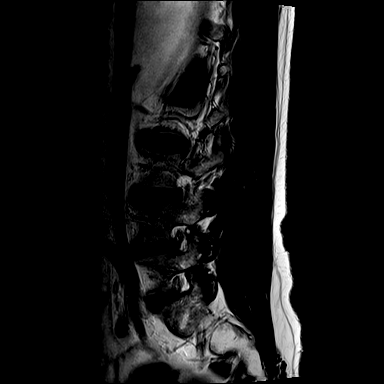
[im 6/15]
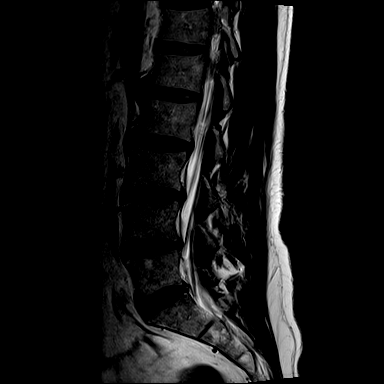
[im 9/15]
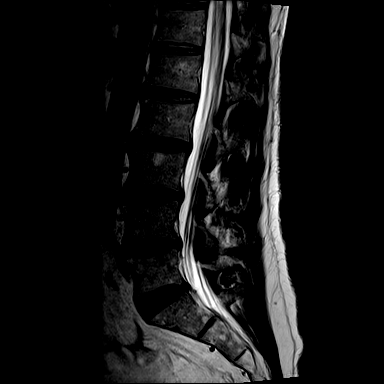
[im 12/15]
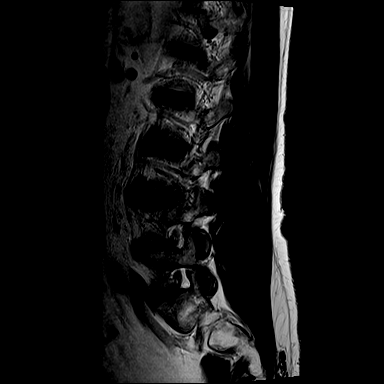
[im 15/15]
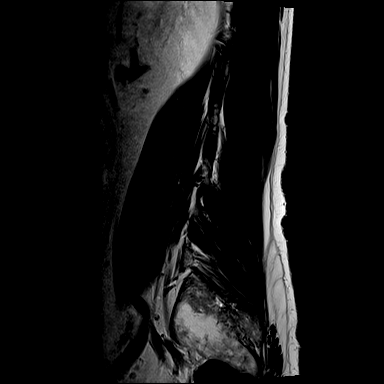

[Series 6: T1 · sagittal · 4.0mm · 0.81mm/px · 7 of 15 slices shown (1 of 2)]
[im 1/15]
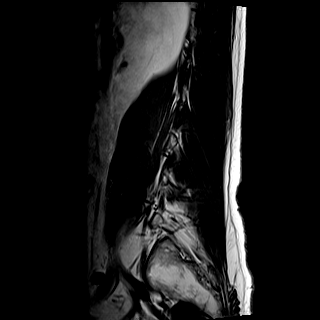
[im 3/15]
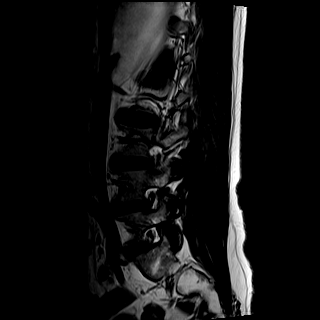
[im 5/15]
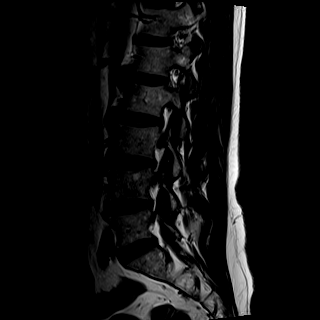
[im 8/15]
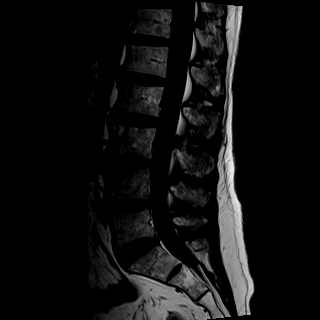
[im 10/15]
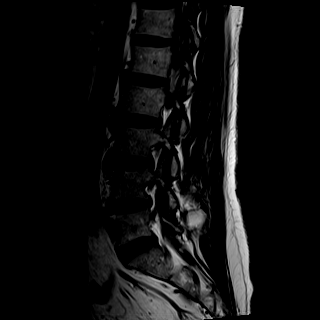
[im 12/15]
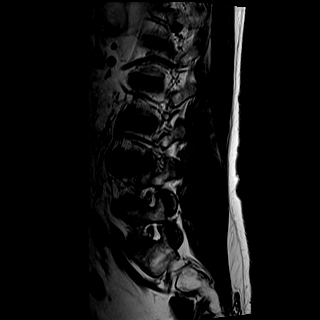
[im 15/15]
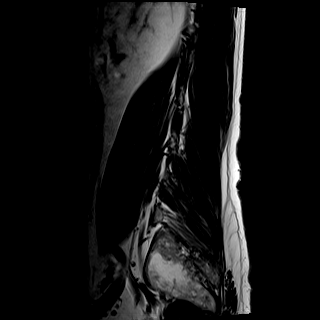

[Series 7: STIR · sagittal · 4.0mm · 0.51mm/px · 2 of 15 slices shown]
[im 1/15]
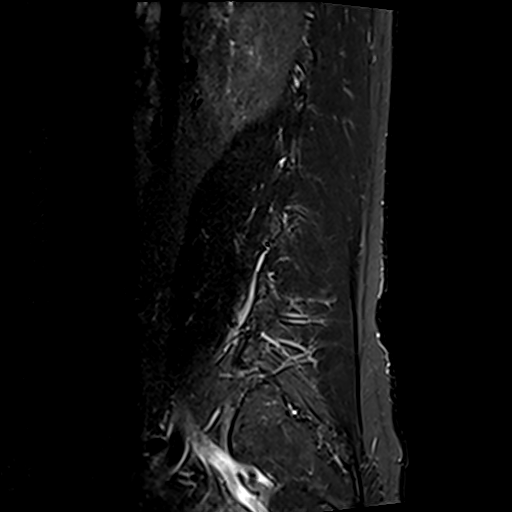
[im 3/15]
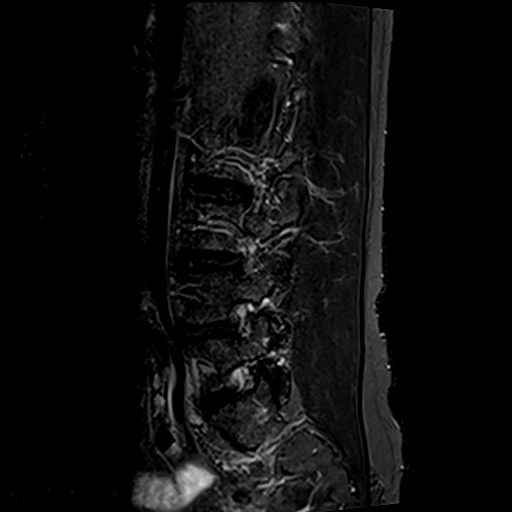

[Series 8: T2 · axial · 4.0mm · 0.70mm/px · z∈[-137,+52]mm · 8 of 31 slices shown (2 of 2)]
[im 1/31]
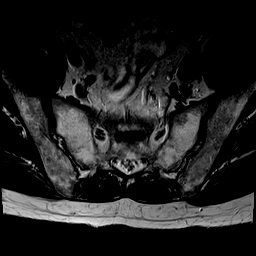
[im 5/31]
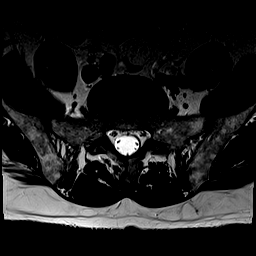
[im 10/31]
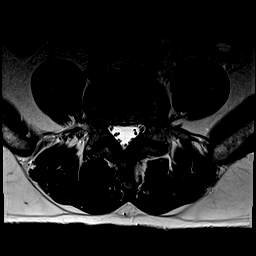
[im 14/31]
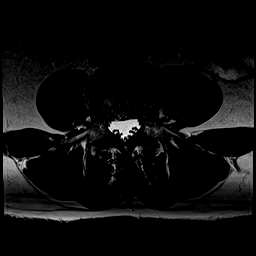
[im 17/31]
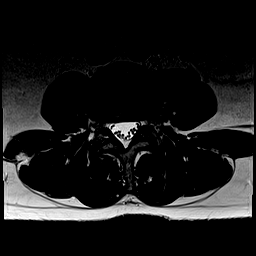
[im 21/31]
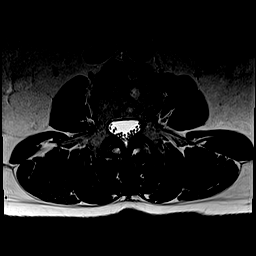
[im 26/31]
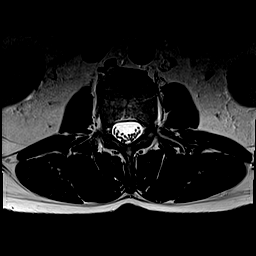
[im 31/31]
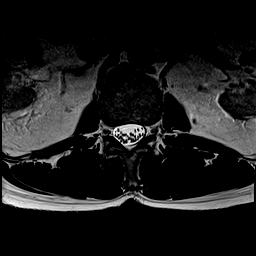

[Series 9: T1 · axial · 4.0mm · 0.35mm/px · z∈[-137,+52]mm · 8 of 31 slices shown (2 of 2)]
[im 1/31]
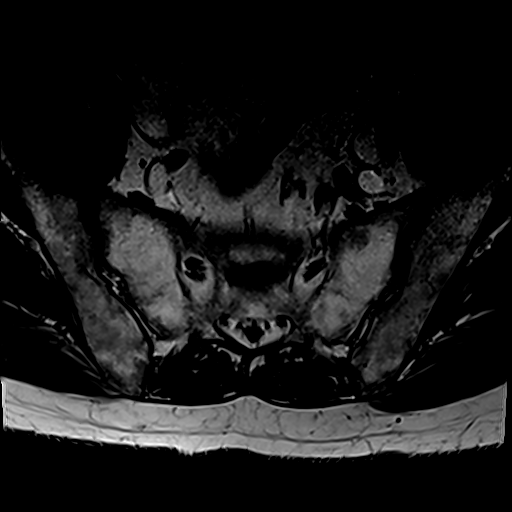
[im 5/31]
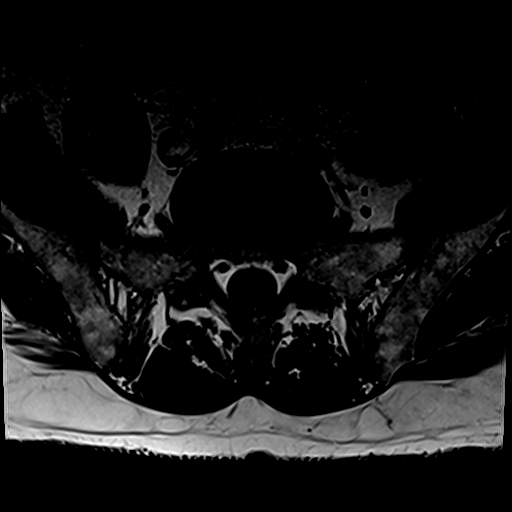
[im 10/31]
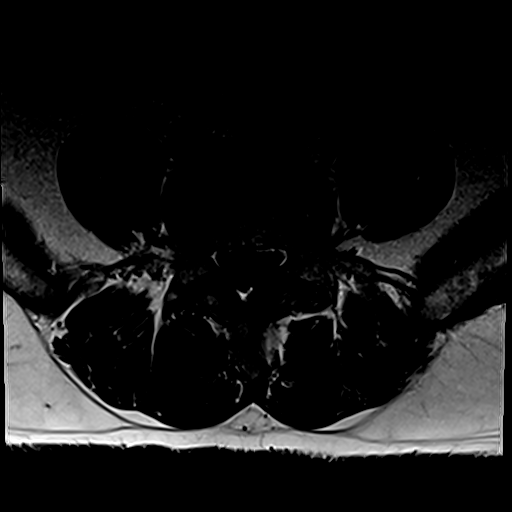
[im 14/31]
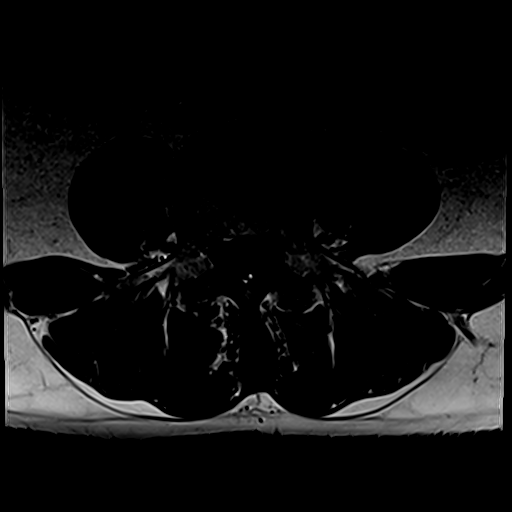
[im 17/31]
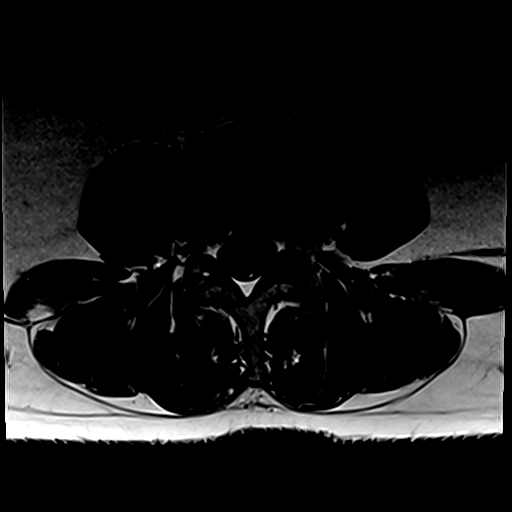
[im 21/31]
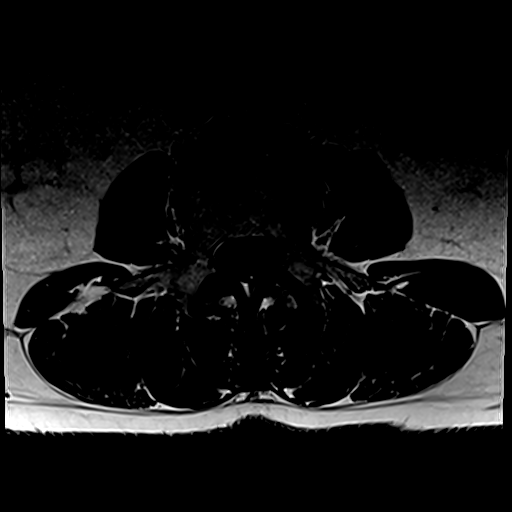
[im 26/31]
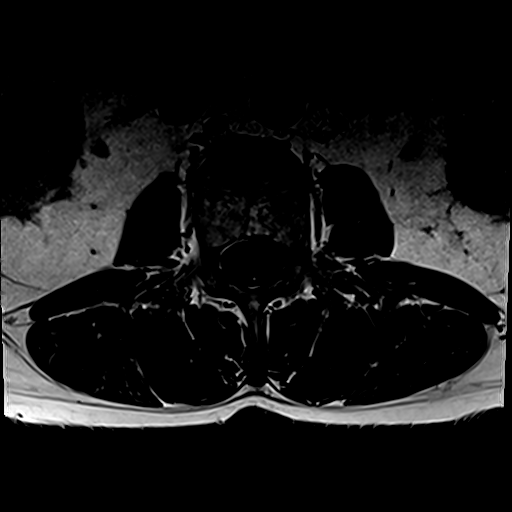
[im 31/31]
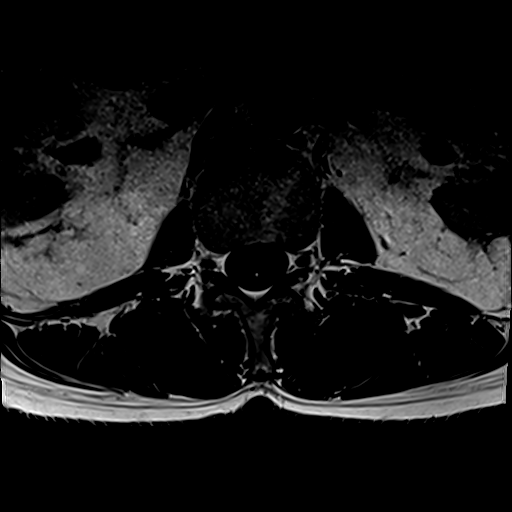

[31 of 48 positions shown; findings below may reference images not displayed]

FINDINGS: Segmentation: Standard segmentation is assumed. The inferior-most
fully formed intervertebral disc is labeled L5-S1.

Alignment:  No substantial sagittal subluxation.

Vertebrae: Mild degenerative/discogenic endplate signal changes at
L1-L2 and L2-L3. Otherwise, no focal marrow edema chest acute
fracture discitis/osteomyelitis. No suspicious bone lesions.

Conus medullaris and cauda equina: Conus extends to the L1 level.
Conus and cauda equina appear normal.

Paraspinal and other soft tissues: Unremarkable.

Disc levels:

T12-L1: Only imaged sagittally. No significant disc protrusion,
foraminal stenosis, or canal stenosis.

L1-L2: No significant disc protrusion, foraminal stenosis, or canal
stenosis.

L2-L3: Mild disc bulging without significant canal or foraminal
stenosis.

L3-L4:Mild disc bulging and mild facet arthropathy with mild
bilateral foraminal stenosis. Mild right greater than left
subarticular recess stenosis without significant canal stenosis.

L4-L5: Mild disc bulging with small annular fissure. Mild bilateral
facet arthropathy. Resulting mild bilateral foraminal stenosis and
mild right greater than left subarticular recess stenosis without
significant canal stenosis.

L5-S1: Mild disc bulging and mild facet arthropathy. No significant
canal or foraminal stenosis.
IMPRESSION: Mild bilateral foraminal and subarticular recess stenosis at L3-L4
and L4-L5. No significant canal stenosis.

## 2022-07-05 ENCOUNTER — Other Ambulatory Visit (HOSPITAL_COMMUNITY): Payer: Self-pay

## 2022-08-09 ENCOUNTER — Other Ambulatory Visit (HOSPITAL_COMMUNITY): Payer: Self-pay

## 2022-08-18 ENCOUNTER — Other Ambulatory Visit (HOSPITAL_COMMUNITY): Payer: Self-pay

## 2022-08-18 MED ORDER — OMEPRAZOLE 20 MG PO CPDR
20.0000 mg | DELAYED_RELEASE_CAPSULE | Freq: Every day | ORAL | 3 refills | Status: DC
  Filled 2022-08-18: qty 90, 90d supply, fill #0
  Filled 2022-12-05: qty 90, 90d supply, fill #1
  Filled 2023-03-06: qty 90, 90d supply, fill #2
  Filled 2023-06-05: qty 90, 90d supply, fill #3

## 2022-08-18 MED ORDER — ATORVASTATIN CALCIUM 20 MG PO TABS
20.0000 mg | ORAL_TABLET | Freq: Every day | ORAL | 3 refills | Status: DC
  Filled 2022-08-18 – 2022-10-03 (×2): qty 90, 90d supply, fill #0
  Filled 2023-01-09: qty 90, 90d supply, fill #1
  Filled 2023-04-17: qty 90, 90d supply, fill #2
  Filled 2023-07-24 – 2023-08-01 (×2): qty 90, 90d supply, fill #3

## 2022-08-18 MED ORDER — TERBINAFINE HCL 250 MG PO TABS
250.0000 mg | ORAL_TABLET | Freq: Every day | ORAL | 4 refills | Status: DC
  Filled 2022-08-18: qty 14, 14d supply, fill #0
  Filled 2022-09-12: qty 14, 14d supply, fill #1

## 2022-08-18 MED ORDER — TRIAMCINOLONE ACETONIDE 0.1 % EX CREA
TOPICAL_CREAM | CUTANEOUS | 2 refills | Status: DC
  Filled 2022-08-18: qty 45, 15d supply, fill #0

## 2022-08-18 MED ORDER — TRAZODONE HCL 50 MG PO TABS
100.0000 mg | ORAL_TABLET | Freq: Every evening | ORAL | 6 refills | Status: DC
  Filled 2022-08-18 – 2022-09-12 (×2): qty 60, 30d supply, fill #0
  Filled 2022-10-10: qty 60, 30d supply, fill #1
  Filled 2022-11-14: qty 60, 30d supply, fill #2
  Filled 2022-12-19 – 2022-12-24 (×2): qty 60, 30d supply, fill #3
  Filled 2023-01-16 – 2023-01-23 (×2): qty 60, 30d supply, fill #4
  Filled 2023-02-20: qty 60, 30d supply, fill #5
  Filled 2023-03-27: qty 60, 30d supply, fill #6

## 2022-09-07 ENCOUNTER — Other Ambulatory Visit (HOSPITAL_COMMUNITY): Payer: Self-pay

## 2022-09-07 MED ORDER — PREDNISONE 10 MG PO TABS
ORAL_TABLET | ORAL | 0 refills | Status: AC
  Filled 2022-09-07: qty 45, 15d supply, fill #0

## 2022-09-13 ENCOUNTER — Other Ambulatory Visit (HOSPITAL_COMMUNITY): Payer: Self-pay

## 2022-10-04 ENCOUNTER — Other Ambulatory Visit (HOSPITAL_COMMUNITY): Payer: Self-pay

## 2022-10-11 ENCOUNTER — Other Ambulatory Visit (HOSPITAL_COMMUNITY): Payer: Self-pay

## 2022-11-14 ENCOUNTER — Other Ambulatory Visit (HOSPITAL_COMMUNITY): Payer: Self-pay

## 2022-11-15 ENCOUNTER — Other Ambulatory Visit (HOSPITAL_COMMUNITY): Payer: Self-pay

## 2022-12-06 ENCOUNTER — Other Ambulatory Visit: Payer: Self-pay

## 2022-12-19 ENCOUNTER — Other Ambulatory Visit (HOSPITAL_COMMUNITY): Payer: Self-pay

## 2022-12-24 ENCOUNTER — Other Ambulatory Visit (HOSPITAL_COMMUNITY): Payer: Self-pay

## 2022-12-31 ENCOUNTER — Other Ambulatory Visit (HOSPITAL_COMMUNITY): Payer: Self-pay

## 2023-01-10 ENCOUNTER — Other Ambulatory Visit (HOSPITAL_COMMUNITY): Payer: Self-pay

## 2023-01-17 ENCOUNTER — Other Ambulatory Visit: Payer: Self-pay

## 2023-01-24 ENCOUNTER — Other Ambulatory Visit (HOSPITAL_COMMUNITY): Payer: Self-pay

## 2023-02-20 ENCOUNTER — Other Ambulatory Visit (HOSPITAL_COMMUNITY): Payer: Self-pay

## 2023-03-07 ENCOUNTER — Other Ambulatory Visit (HOSPITAL_COMMUNITY): Payer: Self-pay

## 2023-03-27 ENCOUNTER — Other Ambulatory Visit (HOSPITAL_COMMUNITY): Payer: Self-pay

## 2023-03-28 ENCOUNTER — Telehealth: Payer: 59 | Admitting: Family Medicine

## 2023-03-28 DIAGNOSIS — L259 Unspecified contact dermatitis, unspecified cause: Secondary | ICD-10-CM | POA: Diagnosis not present

## 2023-03-28 MED ORDER — TRIAMCINOLONE ACETONIDE 0.1 % EX CREA: TOPICAL_CREAM | Freq: Two times a day (BID) | CUTANEOUS | 0 refills | Status: AC

## 2023-03-28 NOTE — Progress Notes (Signed)
E Visit for Rash  We are sorry that you are not feeling well. Here is how we plan to help!  Based on what you shared with me it looks like you have contact dermatitis.  Contact dermatitis is a skin rash caused by something that touches the skin and causes irritation or inflammation.  Your skin may be red, swollen, dry, cracked, and itch.  The rash should go away in a few days but can last a few weeks.  If you get a rash, it's important to figure out what caused it so the irritant can be avoided in the future.   I will be sending a cream called Triamcinolone to the pharmacy you have on file. HOME CARE:  Take cool showers and avoid direct sunlight. Apply cool compress or wet dressings. Take a bath in an oatmeal bath.  Sprinkle content of one Aveeno packet under running faucet with comfortably warm water.  Bathe for 15-20 minutes, 1-2 times daily.  Pat dry with a towel. Do not rub the rash. Use hydrocortisone cream. Take an antihistamine like Benadryl for widespread rashes that itch.  The adult dose of Benadryl is 25-50 mg by mouth 4 times daily. Caution:  This type of medication may cause sleepiness.  Do not drink alcohol, drive, or operate dangerous machinery while taking antihistamines.  Do not take these medications if you have prostate enlargement.  Read package instructions thoroughly on all medications that you take.  GET HELP RIGHT AWAY IF:  Symptoms don't go away after treatment. Severe itching that persists. If you rash spreads or swells. If you rash begins to smell. If it blisters and opens or develops a yellow-brown crust. You develop a fever. You have a sore throat. You become short of breath.  MAKE SURE YOU:  Understand these instructions. Will watch your condition. Will get help right away if you are not doing well or get worse.  Thank you for choosing an e-visit.  Your e-visit answers were reviewed by a board certified advanced clinical practitioner to complete your  personal care plan. Depending upon the condition, your plan could have included both over the counter or prescription medications.  Please review your pharmacy choice. Make sure the pharmacy is open so you can pick up prescription now. If there is a problem, you may contact your provider through [employer] and have the prescription routed to another pharmacy.  Your safety is important to [place]. If you have drug allergies check your prescription carefully.   For the next 24 hours you can use MyChart to ask questions about today's visit, request a non-urgent call back, or ask for a work or school excuse. You will get an email in the next two days asking about your experience. I hope that your e-visit has been valuable and will speed your recovery.  I have spent 5 minutes in review of e-visit questionnaire, review and updating patient chart, medical decision making and response to patient.   Reed Pandy, PA-C

## 2023-04-07 ENCOUNTER — Other Ambulatory Visit: Payer: Self-pay

## 2023-04-18 ENCOUNTER — Other Ambulatory Visit (HOSPITAL_COMMUNITY): Payer: Self-pay

## 2023-04-24 ENCOUNTER — Other Ambulatory Visit (HOSPITAL_COMMUNITY): Payer: Self-pay

## 2023-04-25 ENCOUNTER — Other Ambulatory Visit (HOSPITAL_COMMUNITY): Payer: Self-pay

## 2023-04-25 MED ORDER — TRAZODONE HCL 50 MG PO TABS
100.0000 mg | ORAL_TABLET | Freq: Every evening | ORAL | 1 refills | Status: DC
  Filled 2023-04-25 – 2023-05-02 (×2): qty 60, 30d supply, fill #0
  Filled 2023-05-31 – 2023-06-03 (×3): qty 60, 30d supply, fill #1

## 2023-05-02 ENCOUNTER — Other Ambulatory Visit (HOSPITAL_COMMUNITY): Payer: Self-pay

## 2023-06-01 ENCOUNTER — Other Ambulatory Visit (HOSPITAL_COMMUNITY): Payer: Self-pay

## 2023-06-03 ENCOUNTER — Other Ambulatory Visit (HOSPITAL_COMMUNITY): Payer: Self-pay

## 2023-06-03 ENCOUNTER — Other Ambulatory Visit: Payer: Self-pay

## 2023-06-06 ENCOUNTER — Other Ambulatory Visit (HOSPITAL_COMMUNITY): Payer: Self-pay

## 2023-07-05 ENCOUNTER — Other Ambulatory Visit (HOSPITAL_COMMUNITY): Payer: Self-pay

## 2023-07-06 ENCOUNTER — Other Ambulatory Visit (HOSPITAL_COMMUNITY): Payer: Self-pay

## 2023-07-06 MED ORDER — TRAZODONE HCL 50 MG PO TABS
100.0000 mg | ORAL_TABLET | Freq: Every evening | ORAL | 1 refills | Status: DC
  Filled 2023-07-06: qty 60, 30d supply, fill #0
  Filled 2023-07-31: qty 60, 30d supply, fill #1

## 2023-07-24 ENCOUNTER — Other Ambulatory Visit (HOSPITAL_COMMUNITY): Payer: Self-pay

## 2023-07-31 ENCOUNTER — Other Ambulatory Visit (HOSPITAL_COMMUNITY): Payer: Self-pay

## 2023-07-31 ENCOUNTER — Other Ambulatory Visit: Payer: Self-pay

## 2023-08-01 ENCOUNTER — Other Ambulatory Visit (HOSPITAL_COMMUNITY): Payer: Self-pay

## 2023-08-28 ENCOUNTER — Other Ambulatory Visit (HOSPITAL_COMMUNITY): Payer: Self-pay

## 2023-08-30 ENCOUNTER — Telehealth: Payer: 59 | Admitting: Physician Assistant

## 2023-08-30 ENCOUNTER — Encounter: Payer: Self-pay | Admitting: *Deleted

## 2023-08-30 DIAGNOSIS — J019 Acute sinusitis, unspecified: Secondary | ICD-10-CM

## 2023-08-30 DIAGNOSIS — B9789 Other viral agents as the cause of diseases classified elsewhere: Secondary | ICD-10-CM | POA: Diagnosis not present

## 2023-08-31 MED ORDER — FLUTICASONE PROPIONATE 50 MCG/ACT NA SUSP: 2.0000 | Freq: Every day | NASAL | 0 refills | Status: DC

## 2023-08-31 MED ORDER — PREDNISONE 20 MG PO TABS: 40.0000 mg | ORAL_TABLET | Freq: Every day | ORAL | 0 refills | Status: DC

## 2023-08-31 NOTE — Progress Notes (Signed)
E-Visit for Sinus Problems  We are sorry that you are not feeling well.  Here is how we plan to help!  Based on what you have shared with me it looks like you have sinusitis.  Sinusitis is inflammation and infection in the sinus cavities of the head.  Based on your presentation I believe you most likely have Acute Viral Sinusitis.This is an infection most likely caused by a virus. There is not specific treatment for viral sinusitis other than to help you with the symptoms until the infection runs its course.  You may use an oral decongestant such as Mucinex D or if you have glaucoma or high blood pressure use plain Mucinex. Saline nasal spray help and can safely be used as often as needed for congestion, I have prescribed: Fluticasone nasal spray two sprays in each nostril once a day. I have also prescribed a short course of steroid to reduce sinus inflammation and speed recovery.  Some authorities believe that zinc sprays or the use of Echinacea may shorten the course of your symptoms.  Sinus infections are not as easily transmitted as other respiratory infection, however we still recommend that you avoid close contact with loved ones, especially the very young and elderly.  Remember to wash your hands thoroughly throughout the day as this is the number one way to prevent the spread of infection!  Home Care: Only take medications as instructed by your medical team. Do not take these medications with alcohol. A steam or ultrasonic humidifier can help congestion.  You can place a towel over your head and breathe in the steam from hot water coming from a faucet. Avoid close contacts especially the very young and the elderly. Cover your mouth when you cough or sneeze. Always remember to wash your hands.  Get Help Right Away If: You develop worsening fever or sinus pain. You develop a severe head ache or visual changes. Your symptoms persist after you have completed your treatment plan.  Make sure  you Understand these instructions. Will watch your condition. Will get help right away if you are not doing well or get worse.   Thank you for choosing an e-visit.  Your e-visit answers were reviewed by a board certified advanced clinical practitioner to complete your personal care plan. Depending upon the condition, your plan could have included both over the counter or prescription medications.  Please review your pharmacy choice. Make sure the pharmacy is open so you can pick up prescription now. If there is a problem, you may contact your provider through [employer] and have the prescription routed to another pharmacy.  Your safety is important to [place]. If you have drug allergies check your prescription carefully.   For the next 24 hours you can use MyChart to ask questions about today's visit, request a non-urgent call back, or ask for a work or school excuse. You will get an email in the next two days asking about your experience. I hope that your e-visit has been valuable and will speed your recovery.

## 2023-08-31 NOTE — Progress Notes (Signed)
I have spent 5 minutes in review of e-visit questionnaire, review and updating patient chart, medical decision making and response to patient.   William Cody Martin, PA-C    

## 2023-09-04 ENCOUNTER — Other Ambulatory Visit (HOSPITAL_COMMUNITY): Payer: Self-pay

## 2023-09-05 ENCOUNTER — Other Ambulatory Visit (HOSPITAL_COMMUNITY): Payer: Self-pay

## 2023-09-05 ENCOUNTER — Other Ambulatory Visit: Payer: Self-pay

## 2023-09-05 DIAGNOSIS — E7849 Other hyperlipidemia: Secondary | ICD-10-CM | POA: Diagnosis not present

## 2023-09-05 DIAGNOSIS — M5136 Other intervertebral disc degeneration, lumbar region: Secondary | ICD-10-CM | POA: Diagnosis not present

## 2023-09-05 DIAGNOSIS — E782 Mixed hyperlipidemia: Secondary | ICD-10-CM | POA: Diagnosis not present

## 2023-09-05 DIAGNOSIS — G4709 Other insomnia: Secondary | ICD-10-CM | POA: Diagnosis not present

## 2023-09-05 DIAGNOSIS — Z0001 Encounter for general adult medical examination with abnormal findings: Secondary | ICD-10-CM | POA: Diagnosis not present

## 2023-09-05 DIAGNOSIS — Z1331 Encounter for screening for depression: Secondary | ICD-10-CM | POA: Diagnosis not present

## 2023-09-05 DIAGNOSIS — Z6826 Body mass index (BMI) 26.0-26.9, adult: Secondary | ICD-10-CM | POA: Diagnosis not present

## 2023-09-05 MED ORDER — ATORVASTATIN CALCIUM 10 MG PO TABS
10.0000 mg | ORAL_TABLET | Freq: Every day | ORAL | 3 refills | Status: DC
  Filled 2023-09-05: qty 90, 90d supply, fill #0
  Filled 2023-11-27: qty 90, 90d supply, fill #1
  Filled 2024-02-26: qty 90, 90d supply, fill #2
  Filled 2024-05-27: qty 90, 90d supply, fill #3

## 2023-09-05 MED ORDER — OMEPRAZOLE 20 MG PO CPDR
20.0000 mg | DELAYED_RELEASE_CAPSULE | Freq: Every day | ORAL | 3 refills | Status: DC
  Filled 2023-09-05: qty 90, 90d supply, fill #0
  Filled 2023-12-02 – 2023-12-05 (×3): qty 90, 90d supply, fill #1
  Filled 2024-03-04: qty 90, 90d supply, fill #2
  Filled 2024-06-03: qty 90, 90d supply, fill #3

## 2023-09-05 MED ORDER — TRAZODONE HCL 50 MG PO TABS
100.0000 mg | ORAL_TABLET | Freq: Every day | ORAL | 2 refills | Status: DC
  Filled 2023-09-05: qty 180, 90d supply, fill #0
  Filled 2023-11-27: qty 180, 90d supply, fill #1
  Filled 2024-02-26: qty 180, 90d supply, fill #2

## 2023-09-06 ENCOUNTER — Other Ambulatory Visit (HOSPITAL_COMMUNITY): Payer: Self-pay

## 2023-11-28 ENCOUNTER — Other Ambulatory Visit (HOSPITAL_COMMUNITY): Payer: Self-pay

## 2023-12-02 ENCOUNTER — Other Ambulatory Visit (HOSPITAL_COMMUNITY): Payer: Self-pay

## 2023-12-05 ENCOUNTER — Other Ambulatory Visit: Payer: Self-pay

## 2023-12-05 ENCOUNTER — Other Ambulatory Visit (HOSPITAL_COMMUNITY): Payer: Self-pay

## 2023-12-06 ENCOUNTER — Other Ambulatory Visit (HOSPITAL_COMMUNITY): Payer: Self-pay

## 2024-01-09 ENCOUNTER — Other Ambulatory Visit (HOSPITAL_COMMUNITY): Payer: Self-pay | Admitting: Family Medicine

## 2024-01-09 ENCOUNTER — Other Ambulatory Visit (HOSPITAL_COMMUNITY): Payer: Self-pay

## 2024-01-09 DIAGNOSIS — F419 Anxiety disorder, unspecified: Secondary | ICD-10-CM | POA: Diagnosis not present

## 2024-01-09 DIAGNOSIS — E782 Mixed hyperlipidemia: Secondary | ICD-10-CM | POA: Diagnosis not present

## 2024-01-09 DIAGNOSIS — E7849 Other hyperlipidemia: Secondary | ICD-10-CM

## 2024-01-09 MED ORDER — ALPRAZOLAM 0.25 MG PO TABS
0.2500 mg | ORAL_TABLET | Freq: Three times a day (TID) | ORAL | 1 refills | Status: DC | PRN
  Filled 2024-01-09: qty 60, 20d supply, fill #0

## 2024-01-16 ENCOUNTER — Ambulatory Visit (HOSPITAL_COMMUNITY)
Admission: RE | Admit: 2024-01-16 | Discharge: 2024-01-16 | Disposition: A | Discharge status: Home / Self Care | Payer: 59 | Source: Ambulatory Visit | Attending: Family Medicine | Admitting: Family Medicine

## 2024-01-16 DIAGNOSIS — E7849 Other hyperlipidemia: Secondary | ICD-10-CM

## 2024-01-17 ENCOUNTER — Other Ambulatory Visit (HOSPITAL_COMMUNITY): Payer: 59

## 2024-01-24 ENCOUNTER — Telehealth: Payer: 59 | Admitting: Physician Assistant

## 2024-01-24 DIAGNOSIS — H1033 Unspecified acute conjunctivitis, bilateral: Secondary | ICD-10-CM

## 2024-01-24 MED ORDER — POLYMYXIN B-TRIMETHOPRIM 10000-0.1 UNIT/ML-% OP SOLN
OPHTHALMIC | 0 refills | Status: DC
Start: 2024-01-24 — End: 2024-05-11

## 2024-01-24 NOTE — Progress Notes (Signed)
 I have spent 5 minutes in review of e-visit questionnaire, review and updating patient chart, medical decision making and response to patient.   Piedad Climes, PA-C

## 2024-01-24 NOTE — Progress Notes (Signed)

## 2024-01-24 NOTE — Progress Notes (Signed)
 Message sent to patient requesting further input regarding current symptoms. Awaiting patient response.

## 2024-01-26 ENCOUNTER — Telehealth: Payer: 59 | Admitting: Family Medicine

## 2024-01-26 DIAGNOSIS — J069 Acute upper respiratory infection, unspecified: Secondary | ICD-10-CM

## 2024-01-26 MED ORDER — PROMETHAZINE-DM 6.25-15 MG/5ML PO SYRP
5.0000 mL | ORAL_SOLUTION | Freq: Four times a day (QID) | ORAL | 0 refills | Status: DC | PRN
Start: 2024-01-26 — End: 2024-05-11

## 2024-01-26 MED ORDER — IPRATROPIUM BROMIDE 0.03 % NA SOLN
2.0000 | Freq: Two times a day (BID) | NASAL | 0 refills | Status: DC
Start: 2024-01-26 — End: 2024-05-11

## 2024-01-26 MED ORDER — BENZONATATE 100 MG PO CAPS
100.0000 mg | ORAL_CAPSULE | Freq: Three times a day (TID) | ORAL | 0 refills | Status: DC | PRN
Start: 2024-01-26 — End: 2024-05-11

## 2024-01-26 NOTE — Progress Notes (Signed)
 E-Visit for Upper Respiratory Infection   We are sorry you are not feeling well.  Here is how we plan to help!  Based on what you have shared with me, it looks like you may have a viral upper respiratory infection.  Upper respiratory infections are caused by a large number of viruses; however, rhinovirus is the most common cause.   Symptoms vary from person to person, with common symptoms including sore throat, cough, fatigue or lack of energy and feeling of general discomfort.  A low-grade fever of up to 100.4 may present, but is often uncommon.  Symptoms vary however, and are closely related to a person's age or underlying illnesses.  The most common symptoms associated with an upper respiratory infection are nasal discharge or congestion, cough, sneezing, headache and pressure in the ears and face.  These symptoms usually persist for about 3 to 10 days, but can last up to 2 weeks.  It is important to know that upper respiratory infections do not cause serious illness or complications in most cases.    Upper respiratory infections can be transmitted from person to person, with the most common method of transmission being a person's hands.  The virus is able to live on the skin and can infect other persons for up to 2 hours after direct contact.  Also, these can be transmitted when someone coughs or sneezes; thus, it is important to cover the mouth to reduce this risk.  To keep the spread of the illness at bay, good hand hygiene is very important.  This is an infection that is most likely caused by a virus. There are no specific treatments other than to help you with the symptoms until the infection runs its course.  We are sorry you are not feeling well.  Here is how we plan to help!   For nasal congestion, you may use an oral decongestants such as Mucinex D or if you have glaucoma or high blood pressure use plain Mucinex.  Saline nasal spray or nasal drops can help and can safely be used as often as  needed for congestion.  For your congestion, I have prescribed Ipratropium Bromide  nasal spray 0.03% two sprays in each nostril 2-3 times a day (you can try it, but I know you stated it does not help much)  If you do not have a history of heart disease, hypertension, diabetes or thyroid disease, prostate/bladder issues or glaucoma, you may also use Sudafed to treat nasal congestion.  It is highly recommended that you consult with a pharmacist or your primary care physician to ensure this medication is safe for you to take.     If you have a cough, you may use cough suppressants such as Delsym and Robitussin.  If you have glaucoma or high blood pressure, you can also use Coricidin HBP.   For cough I have prescribed for you A prescription cough medication called Tessalon  Perles 100 mg. You may take 1-2 capsules every 8 hours as needed for cough along with a cough syrup to help with congestion and cough- Promethazine  DM  If you have a sore or scratchy throat, use a saltwater gargle-  to  teaspoon of salt dissolved in a 4-ounce to 8-ounce glass of warm water .  Gargle the solution for approximately 15-30 seconds and then spit.  It is important not to swallow the solution.  You can also use throat lozenges/cough drops and Chloraseptic spray to help with throat pain or discomfort.  Warm or  cold liquids can also be helpful in relieving throat pain.  For headache, pain or general discomfort, you can use Ibuprofen or Tylenol  as directed.   Some authorities believe that zinc sprays or the use of Echinacea may shorten the course of your symptoms.   HOME CARE Only take medications as instructed by your medical team. Be sure to drink plenty of fluids. Water  is fine as well as fruit juices, sodas and electrolyte beverages. You may want to stay away from caffeine or alcohol. If you are nauseated, try taking small sips of liquids. How do you know if you are getting enough fluid? Your urine should be a pale yellow or  almost colorless. Get rest. Taking a steamy shower or using a humidifier may help nasal congestion and ease sore throat pain. You can place a towel over your head and breathe in the steam from hot water  coming from a faucet. Using a saline nasal spray works much the same way. Cough drops, hard candies and sore throat lozenges may ease your cough. Avoid close contacts especially the very young and the elderly Cover your mouth if you cough or sneeze Always remember to wash your hands.   GET HELP RIGHT AWAY IF: You develop worsening fever. If your symptoms do not improve within 10 days You develop yellow or green discharge from your nose over 3 days. You have coughing fits You develop a severe head ache or visual changes. You develop shortness of breath, difficulty breathing or start having chest pain Your symptoms persist after you have completed your treatment plan  MAKE SURE YOU  Understand these instructions. Will watch your condition. Will get help right away if you are not doing well or get worse.  Thank you for choosing an e-visit.  Your e-visit answers were reviewed by a board certified advanced clinical practitioner to complete your personal care plan. Depending upon the condition, your plan could have included both over the counter or prescription medications.  Please review your pharmacy choice. Make sure the pharmacy is open so you can pick up prescription now. If there is a problem, you may contact your provider through [employer] and have the prescription routed to another pharmacy.  Your safety is important to us . If you have drug allergies check your prescription carefully.   For the next 24 hours you can use MyChart to ask questions about today's visit, request a non-urgent call back, or ask for a work or school excuse. You will get an email in the next two days asking about your experience. I hope that your e-visit has been valuable and will speed your  recovery.    I provided 5 minutes of non face-to-face time during this encounter for chart review, medication and order placement, as well as and documentation.

## 2024-01-29 ENCOUNTER — Telehealth: Payer: 59 | Admitting: Family

## 2024-01-29 DIAGNOSIS — J019 Acute sinusitis, unspecified: Secondary | ICD-10-CM

## 2024-01-29 MED ORDER — AMOXICILLIN-POT CLAVULANATE 875-125 MG PO TABS: 1.0000 | ORAL_TABLET | Freq: Two times a day (BID) | ORAL | 0 refills | Status: DC

## 2024-01-29 NOTE — Progress Notes (Signed)

## 2024-02-27 ENCOUNTER — Other Ambulatory Visit (HOSPITAL_COMMUNITY): Payer: Self-pay

## 2024-03-04 ENCOUNTER — Other Ambulatory Visit (HOSPITAL_COMMUNITY): Payer: Self-pay

## 2024-03-05 ENCOUNTER — Other Ambulatory Visit (HOSPITAL_COMMUNITY): Payer: Self-pay

## 2024-05-11 ENCOUNTER — Telehealth: Payer: Self-pay | Admitting: *Deleted

## 2024-05-11 NOTE — Telephone Encounter (Signed)
  Procedure: COLONOSCOPY  Height: 5'9 Weight: 163LBS        Have you had a colonoscopy before?  09/2018 Dr. Riley Cheadle  Do you have family history of colon cancer?  no  Do you have a family history of polyps? no  Previous colonoscopy with polyps removed? yes  Do you have a history colorectal cancer?   no  Are you diabetic?  no  Do you have a prosthetic or mechanical heart valve? no  Do you have a pacemaker/defibrillator?   no  Have you had endocarditis/atrial fibrillation?  no  Do you use supplemental oxygen/CPAP?  no  Have you had joint replacement within the last 12 months?  no  Do you tend to be constipated or have to use laxatives?  no   Do you have history of alcohol use? If yes, how much and how often.  no  Do you have history or are you using drugs? If yes, what do are you  using?  no  Have you ever had a stroke/heart attack?  no  Have you ever had a heart or other vascular stent placed,?no  Do you take weight loss medication? no    Do you take any blood-thinning medications such as: (Plavix, aspirin, Coumadin, Aggrenox, Brilinta, Xarelto, Eliquis, Pradaxa, Savaysa or Effient)? no  If yes we need the name, milligram, dosage and who is prescribing doctor:               Current Outpatient Medications  Medication Sig Dispense Refill   Multiple Vitamin (MULTIVITAMIN) tablet Take 1 tablet by mouth daily.     atorvastatin  (LIPITOR) 10 MG tablet Take 1 tablet (10 mg total) by mouth at bedtime. 90 tablet 3   omeprazole  (PRILOSEC) 20 MG capsule Take 1 capsule (20 mg total) by mouth daily. 90 capsule 3   traZODone  (DESYREL ) 50 MG tablet Take 2 tablets (100 mg) by mouth at bedtime. 180 tablet 2   No current facility-administered medications for this visit.    No Known Allergies

## 2024-05-27 ENCOUNTER — Other Ambulatory Visit (HOSPITAL_COMMUNITY): Payer: Self-pay

## 2024-05-28 ENCOUNTER — Other Ambulatory Visit (HOSPITAL_COMMUNITY): Payer: Self-pay

## 2024-05-28 ENCOUNTER — Other Ambulatory Visit: Payer: Self-pay

## 2024-05-28 MED ORDER — TRAZODONE HCL 50 MG PO TABS
100.0000 mg | ORAL_TABLET | Freq: Every day | ORAL | 2 refills | Status: AC
  Filled 2024-05-28: qty 180, 90d supply, fill #0
  Filled 2024-08-26 – 2024-08-29 (×2): qty 180, 90d supply, fill #1
  Filled 2024-11-25 – 2024-11-26 (×2): qty 180, 90d supply, fill #2

## 2024-05-31 NOTE — Telephone Encounter (Signed)
Ok to schedule. ASA 2.  

## 2024-05-31 NOTE — Telephone Encounter (Signed)
 LMOVM to return call.

## 2024-06-04 ENCOUNTER — Other Ambulatory Visit (HOSPITAL_COMMUNITY): Payer: Self-pay

## 2024-06-05 ENCOUNTER — Other Ambulatory Visit: Payer: Self-pay | Admitting: *Deleted

## 2024-06-05 ENCOUNTER — Encounter: Payer: Self-pay | Admitting: *Deleted

## 2024-06-05 MED ORDER — PEG 3350-KCL-NA BICARB-NACL 420 G PO SOLR: 4000.0000 mL | Freq: Once | ORAL | 0 refills | Status: AC

## 2024-06-05 NOTE — Telephone Encounter (Signed)
 Pt called back and says he can't do 07/05/24, so he is scheduled for 07/12/24

## 2024-06-05 NOTE — Telephone Encounter (Signed)
 Pt has been scheduled with Dr.Rourk on 07/05/24. Instructions mailed and prep sent to pharmacy

## 2024-06-12 NOTE — Telephone Encounter (Signed)
 Questionnaire from recall, no referral needed

## 2024-06-28 ENCOUNTER — Telehealth: Payer: Self-pay | Admitting: *Deleted

## 2024-06-28 ENCOUNTER — Other Ambulatory Visit (HOSPITAL_COMMUNITY): Payer: Self-pay

## 2024-06-28 NOTE — Telephone Encounter (Signed)
 Patient called in to cancel procedure 7/24 with Dr. Shaaron. To expensive right now and wants to wait until he changes insurance. Will call back when ready

## 2024-07-06 ENCOUNTER — Other Ambulatory Visit (HOSPITAL_COMMUNITY): Payer: Self-pay

## 2024-07-12 ENCOUNTER — Ambulatory Visit (HOSPITAL_COMMUNITY): Admission: RE | Admit: 2024-07-12 | Source: Home / Self Care | Admitting: Internal Medicine

## 2024-07-12 ENCOUNTER — Encounter (HOSPITAL_COMMUNITY): Admission: RE | Payer: Self-pay | Source: Home / Self Care

## 2024-07-12 SURGERY — COLONOSCOPY
Anesthesia: Choice

## 2024-08-26 ENCOUNTER — Other Ambulatory Visit (HOSPITAL_COMMUNITY): Payer: Self-pay

## 2024-08-27 ENCOUNTER — Other Ambulatory Visit: Payer: Self-pay

## 2024-08-27 ENCOUNTER — Other Ambulatory Visit (HOSPITAL_COMMUNITY): Payer: Self-pay

## 2024-08-27 MED ORDER — ATORVASTATIN CALCIUM 10 MG PO TABS
10.0000 mg | ORAL_TABLET | Freq: Every day | ORAL | 0 refills | Status: DC
  Filled 2024-08-27: qty 90, 90d supply, fill #0

## 2024-08-29 ENCOUNTER — Other Ambulatory Visit (HOSPITAL_COMMUNITY): Payer: Self-pay

## 2024-08-31 ENCOUNTER — Other Ambulatory Visit (HOSPITAL_COMMUNITY): Payer: Self-pay

## 2024-09-02 ENCOUNTER — Other Ambulatory Visit (HOSPITAL_COMMUNITY): Payer: Self-pay

## 2024-09-03 ENCOUNTER — Other Ambulatory Visit (HOSPITAL_COMMUNITY): Payer: Self-pay

## 2024-09-03 MED ORDER — OMEPRAZOLE 20 MG PO CPDR
20.0000 mg | DELAYED_RELEASE_CAPSULE | Freq: Every day | ORAL | 3 refills | Status: AC
  Filled 2024-09-03: qty 90, 90d supply, fill #0
  Filled 2024-12-02 – 2024-12-03 (×2): qty 90, 90d supply, fill #1

## 2024-09-27 ENCOUNTER — Other Ambulatory Visit (HOSPITAL_COMMUNITY): Payer: Self-pay

## 2024-09-27 ENCOUNTER — Other Ambulatory Visit: Payer: Self-pay

## 2024-09-27 DIAGNOSIS — Z125 Encounter for screening for malignant neoplasm of prostate: Secondary | ICD-10-CM | POA: Diagnosis not present

## 2024-09-27 DIAGNOSIS — Z0000 Encounter for general adult medical examination without abnormal findings: Secondary | ICD-10-CM | POA: Diagnosis not present

## 2024-09-27 DIAGNOSIS — F411 Generalized anxiety disorder: Secondary | ICD-10-CM | POA: Diagnosis not present

## 2024-09-27 DIAGNOSIS — K219 Gastro-esophageal reflux disease without esophagitis: Secondary | ICD-10-CM | POA: Diagnosis not present

## 2024-09-27 DIAGNOSIS — E785 Hyperlipidemia, unspecified: Secondary | ICD-10-CM | POA: Diagnosis not present

## 2024-09-27 MED ORDER — ESCITALOPRAM OXALATE 10 MG PO TABS
10.0000 mg | ORAL_TABLET | Freq: Every day | ORAL | 1 refills | Status: AC
  Filled 2024-09-27: qty 90, 90d supply, fill #0
  Filled 2024-12-16 – 2024-12-24 (×5): qty 90, 90d supply, fill #1

## 2024-09-27 MED ORDER — OMEPRAZOLE 20 MG PO CPDR
20.0000 mg | DELAYED_RELEASE_CAPSULE | Freq: Every day | ORAL | 3 refills | Status: AC
  Filled 2024-09-27: qty 90, 90d supply, fill #0

## 2024-09-27 MED ORDER — TRAZODONE HCL 50 MG PO TABS
50.0000 mg | ORAL_TABLET | Freq: Every day | ORAL | 1 refills | Status: AC
  Filled 2024-09-27: qty 90, 90d supply, fill #0

## 2024-09-27 MED ORDER — ATORVASTATIN CALCIUM 10 MG PO TABS
10.0000 mg | ORAL_TABLET | Freq: Every day | ORAL | 3 refills | Status: AC
  Filled 2024-09-27 – 2024-11-26 (×3): qty 90, 90d supply, fill #0

## 2024-11-16 DIAGNOSIS — H524 Presbyopia: Secondary | ICD-10-CM | POA: Diagnosis not present

## 2024-11-25 ENCOUNTER — Other Ambulatory Visit (HOSPITAL_COMMUNITY): Payer: Self-pay

## 2024-11-26 ENCOUNTER — Other Ambulatory Visit: Payer: Self-pay

## 2024-11-26 ENCOUNTER — Other Ambulatory Visit (HOSPITAL_COMMUNITY): Payer: Self-pay

## 2024-12-03 ENCOUNTER — Other Ambulatory Visit: Payer: Self-pay

## 2024-12-03 ENCOUNTER — Other Ambulatory Visit (HOSPITAL_COMMUNITY): Payer: Self-pay

## 2024-12-16 ENCOUNTER — Other Ambulatory Visit (HOSPITAL_COMMUNITY): Payer: Self-pay

## 2024-12-17 ENCOUNTER — Other Ambulatory Visit (HOSPITAL_COMMUNITY): Payer: Self-pay

## 2024-12-24 ENCOUNTER — Other Ambulatory Visit (HOSPITAL_COMMUNITY): Payer: Self-pay

## 2024-12-25 ENCOUNTER — Other Ambulatory Visit: Payer: Self-pay

## 2024-12-27 ENCOUNTER — Other Ambulatory Visit: Payer: Self-pay

## 2024-12-27 ENCOUNTER — Other Ambulatory Visit (HOSPITAL_COMMUNITY): Payer: Self-pay

## 2024-12-28 ENCOUNTER — Encounter: Payer: Self-pay | Admitting: *Deleted

## 2024-12-28 ENCOUNTER — Other Ambulatory Visit: Payer: Self-pay | Admitting: *Deleted

## 2024-12-28 MED ORDER — PEG 3350-KCL-NA BICARB-NACL 420 G PO SOLR: 4000.0000 mL | Freq: Once | ORAL | 0 refills | Status: AC

## 2025-01-01 ENCOUNTER — Telehealth: Payer: Self-pay | Admitting: *Deleted

## 2025-01-01 ENCOUNTER — Encounter: Payer: Self-pay | Admitting: *Deleted

## 2025-01-01 NOTE — Telephone Encounter (Signed)
 Pt called to reschedule his procedure to a day that would be an earlier arrival time due to his wife's job. He has been rescheduled to 02/01/25. Updated instructions sent via mychart

## 2025-01-03 ENCOUNTER — Other Ambulatory Visit (HOSPITAL_COMMUNITY): Payer: Self-pay

## 2025-01-29 ENCOUNTER — Encounter (HOSPITAL_COMMUNITY)

## 2025-02-01 ENCOUNTER — Ambulatory Visit (HOSPITAL_COMMUNITY): Admission: RE | Admit: 2025-02-01 | Admitting: Internal Medicine

## 2025-02-01 ENCOUNTER — Encounter (HOSPITAL_COMMUNITY): Admission: RE | Payer: Self-pay | Source: Home / Self Care

## 2025-02-01 SURGERY — COLONOSCOPY
Anesthesia: Choice
